# Patient Record
Sex: Male | Born: 1965
Health system: Southern US, Community
[De-identification: ages and names within clinical notes are randomized; demographics above are authoritative.]

## PROBLEM LIST (undated history)

## (undated) DIAGNOSIS — G4700 Insomnia, unspecified: Secondary | ICD-10-CM

## (undated) DIAGNOSIS — G479 Sleep disorder, unspecified: Secondary | ICD-10-CM

## (undated) DIAGNOSIS — E785 Hyperlipidemia, unspecified: Secondary | ICD-10-CM

## (undated) DIAGNOSIS — L679 Hair color and hair shaft abnormality, unspecified: Secondary | ICD-10-CM

## (undated) HISTORY — DX: Insomnia, unspecified: G47.00

## (undated) HISTORY — PX: OTHER SURGICAL HISTORY: SHX169

## (undated) HISTORY — PX: SEPTOPLASTY: SUR1290

## (undated) HISTORY — DX: Hair color and hair shaft abnormality, unspecified: L67.9

## (undated) HISTORY — DX: Sleep disorder, unspecified: G47.9

## (undated) HISTORY — PX: COLONOSCOPY: SHX174

## (undated) HISTORY — DX: Hyperlipidemia, unspecified: E78.5

---

## 2010-06-04 ENCOUNTER — Emergency Department (HOSPITAL_COMMUNITY)
Admission: EM | Admit: 2010-06-04 | Discharge: 2010-06-04 | Payer: Self-pay | Source: Home / Self Care | Admitting: Emergency Medicine

## 2010-06-04 LAB — POCT H PYLORI SCREEN: H. PYLORI SCREEN, POC: NEGATIVE

## 2010-12-10 ENCOUNTER — Emergency Department (HOSPITAL_COMMUNITY)
Admission: EM | Admit: 2010-12-10 | Discharge: 2010-12-11 | Disposition: A | Discharge status: Home / Self Care | Payer: 59 | Attending: Emergency Medicine | Admitting: Emergency Medicine

## 2010-12-10 DIAGNOSIS — R1033 Periumbilical pain: Secondary | ICD-10-CM | POA: Insufficient documentation

## 2010-12-10 DIAGNOSIS — K5289 Other specified noninfective gastroenteritis and colitis: Secondary | ICD-10-CM | POA: Insufficient documentation

## 2010-12-10 DIAGNOSIS — K279 Peptic ulcer, site unspecified, unspecified as acute or chronic, without hemorrhage or perforation: Secondary | ICD-10-CM | POA: Insufficient documentation

## 2010-12-10 DIAGNOSIS — R110 Nausea: Secondary | ICD-10-CM | POA: Insufficient documentation

## 2010-12-10 DIAGNOSIS — R1013 Epigastric pain: Secondary | ICD-10-CM | POA: Insufficient documentation

## 2010-12-11 ENCOUNTER — Emergency Department (HOSPITAL_COMMUNITY): Payer: 59

## 2010-12-11 ENCOUNTER — Inpatient Hospital Stay (HOSPITAL_COMMUNITY)
Admission: EM | Admit: 2010-12-11 | Discharge: 2010-12-17 | DRG: 386 | Disposition: A | Discharge status: Home / Self Care | Payer: 59 | Source: Ambulatory Visit | Attending: Internal Medicine | Admitting: Internal Medicine

## 2010-12-11 DIAGNOSIS — K5000 Crohn's disease of small intestine without complications: Principal | ICD-10-CM | POA: Diagnosis present

## 2010-12-11 DIAGNOSIS — R112 Nausea with vomiting, unspecified: Secondary | ICD-10-CM

## 2010-12-11 DIAGNOSIS — I809 Phlebitis and thrombophlebitis of unspecified site: Secondary | ICD-10-CM | POA: Diagnosis present

## 2010-12-11 DIAGNOSIS — K56609 Unspecified intestinal obstruction, unspecified as to partial versus complete obstruction: Secondary | ICD-10-CM | POA: Diagnosis present

## 2010-12-11 DIAGNOSIS — R1084 Generalized abdominal pain: Secondary | ICD-10-CM

## 2010-12-11 DIAGNOSIS — G609 Hereditary and idiopathic neuropathy, unspecified: Secondary | ICD-10-CM | POA: Diagnosis present

## 2010-12-11 LAB — COMPREHENSIVE METABOLIC PANEL
ALT: 21 U/L (ref 0–53)
AST: 20 U/L (ref 0–37)
Albumin: 3.9 g/dL (ref 3.5–5.2)
Alkaline Phosphatase: 50 U/L (ref 39–117)
BUN: 12 mg/dL (ref 6–23)
CO2: 25 mEq/L (ref 19–32)
Calcium: 9.3 mg/dL (ref 8.4–10.5)
Chloride: 103 mEq/L (ref 96–112)
Creatinine, Ser: 0.86 mg/dL (ref 0.50–1.35)
GFR calc Af Amer: >60 mL/min (ref >60)
GFR calc non Af Amer: >60 mL/min (ref >60)
Glucose, Bld: 101 mg/dL — ABNORMAL HIGH (ref 70–99)
Potassium: 3.7 mEq/L (ref 3.5–5.1)
Sodium: 139 mEq/L (ref 135–145)
Total Bilirubin: 0.4 mg/dL (ref 0.3–1.2)
Total Protein: 6.8 g/dL (ref 6.0–8.3)

## 2010-12-11 LAB — DIFFERENTIAL
Basophils Absolute: 0 10*3/uL (ref 0.0–0.1)
Basophils Relative: 0 % (ref 0–1)
Eosinophils Absolute: 0.1 10*3/uL (ref 0.0–0.7)
Eosinophils Relative: 1 % (ref 0–5)
Lymphocytes Relative: 18 % (ref 12–46)
Lymphs Abs: 2 10*3/uL (ref 0.7–4.0)
Monocytes Absolute: 0.5 10*3/uL (ref 0.1–1.0)
Monocytes Relative: 5 % (ref 3–12)
Neutro Abs: 8.1 10*3/uL — ABNORMAL HIGH (ref 1.7–7.7)
Neutrophils Relative %: 76 % (ref 43–77)

## 2010-12-11 LAB — CBC
HCT: 43.9 % (ref 39.0–52.0)
Hemoglobin: 15.6 g/dL (ref 13.0–17.0)
MCH: 28.9 pg (ref 26.0–34.0)
MCHC: 35.5 g/dL (ref 30.0–36.0)
MCV: 81.4 fL (ref 78.0–100.0)
Platelets: 240 10*3/uL (ref 150–400)
RBC: 5.39 MIL/uL (ref 4.22–5.81)
RDW: 12.4 % (ref 11.5–15.5)
WBC: 10.7 10*3/uL — ABNORMAL HIGH (ref 4.0–10.5)

## 2010-12-11 LAB — C-REACTIVE PROTEIN: CRP: 1.3 mg/dL — ABNORMAL HIGH (ref <0.6)

## 2010-12-11 LAB — LIPASE, BLOOD: Lipase: 24 U/L (ref 11–59)

## 2010-12-11 MED ORDER — IOHEXOL 300 MG/ML  SOLN: 100.0000 mL | Freq: Once | INTRAMUSCULAR | Status: DC | PRN

## 2010-12-12 ENCOUNTER — Inpatient Hospital Stay (HOSPITAL_COMMUNITY): Payer: 59

## 2010-12-12 LAB — COMPREHENSIVE METABOLIC PANEL
ALT: 13 U/L (ref 0–53)
AST: 15 U/L (ref 0–37)
Albumin: 2.8 g/dL — ABNORMAL LOW (ref 3.5–5.2)
Alkaline Phosphatase: 38 U/L — ABNORMAL LOW (ref 39–117)
BUN: 7 mg/dL (ref 6–23)
CO2: 25 mEq/L (ref 19–32)
Calcium: 8 mg/dL — ABNORMAL LOW (ref 8.4–10.5)
Chloride: 107 mEq/L (ref 96–112)
Creatinine, Ser: 0.73 mg/dL (ref 0.50–1.35)
GFR calc Af Amer: >60 mL/min (ref >60)
GFR calc non Af Amer: >60 mL/min (ref >60)
Glucose, Bld: 96 mg/dL (ref 70–99)
Potassium: 4.1 mEq/L (ref 3.5–5.1)
Sodium: 138 mEq/L (ref 135–145)
Total Bilirubin: 0.6 mg/dL (ref 0.3–1.2)
Total Protein: 5.1 g/dL — ABNORMAL LOW (ref 6.0–8.3)

## 2010-12-12 LAB — DIFFERENTIAL
Basophils Absolute: 0 10*3/uL (ref 0.0–0.1)
Basophils Relative: 1 % (ref 0–1)
Eosinophils Absolute: 0.2 10*3/uL (ref 0.0–0.7)
Eosinophils Relative: 4 % (ref 0–5)
Lymphocytes Relative: 42 % (ref 12–46)
Lymphs Abs: 2.4 10*3/uL (ref 0.7–4.0)
Monocytes Absolute: 0.4 10*3/uL (ref 0.1–1.0)
Monocytes Relative: 8 % (ref 3–12)
Neutro Abs: 2.6 10*3/uL (ref 1.7–7.7)
Neutrophils Relative %: 46 % (ref 43–77)

## 2010-12-12 LAB — CBC
HCT: 38.9 % — ABNORMAL LOW (ref 39.0–52.0)
Hemoglobin: 13.4 g/dL (ref 13.0–17.0)
MCH: 28.8 pg (ref 26.0–34.0)
MCHC: 34.4 g/dL (ref 30.0–36.0)
MCV: 83.5 fL (ref 78.0–100.0)
Platelets: 207 10*3/uL (ref 150–400)
RBC: 4.66 MIL/uL (ref 4.22–5.81)
RDW: 12.4 % (ref 11.5–15.5)
WBC: 5.7 10*3/uL (ref 4.0–10.5)

## 2010-12-13 ENCOUNTER — Inpatient Hospital Stay (HOSPITAL_COMMUNITY): Payer: 59

## 2010-12-13 DIAGNOSIS — R933 Abnormal findings on diagnostic imaging of other parts of digestive tract: Secondary | ICD-10-CM

## 2010-12-13 DIAGNOSIS — K56609 Unspecified intestinal obstruction, unspecified as to partial versus complete obstruction: Secondary | ICD-10-CM

## 2010-12-14 DIAGNOSIS — K56609 Unspecified intestinal obstruction, unspecified as to partial versus complete obstruction: Secondary | ICD-10-CM

## 2010-12-14 DIAGNOSIS — R933 Abnormal findings on diagnostic imaging of other parts of digestive tract: Secondary | ICD-10-CM

## 2010-12-15 ENCOUNTER — Other Ambulatory Visit: Payer: Self-pay | Admitting: Internal Medicine

## 2010-12-15 DIAGNOSIS — R109 Unspecified abdominal pain: Secondary | ICD-10-CM

## 2010-12-15 DIAGNOSIS — R933 Abnormal findings on diagnostic imaging of other parts of digestive tract: Secondary | ICD-10-CM

## 2010-12-15 DIAGNOSIS — K56609 Unspecified intestinal obstruction, unspecified as to partial versus complete obstruction: Secondary | ICD-10-CM

## 2010-12-15 LAB — SEDIMENTATION RATE: Sed Rate: 4 mm/hr (ref 0–16)

## 2010-12-15 LAB — CBC
HCT: 37.5 % — ABNORMAL LOW (ref 39.0–52.0)
Hemoglobin: 13.3 g/dL (ref 13.0–17.0)
MCH: 29.2 pg (ref 26.0–34.0)
MCHC: 35.5 g/dL (ref 30.0–36.0)
MCV: 82.2 fL (ref 78.0–100.0)
Platelets: 237 10*3/uL (ref 150–400)
RBC: 4.56 MIL/uL (ref 4.22–5.81)
RDW: 12.3 % (ref 11.5–15.5)
WBC: 9.1 10*3/uL (ref 4.0–10.5)

## 2010-12-15 LAB — BASIC METABOLIC PANEL
BUN: 7 mg/dL (ref 6–23)
CO2: 24 mEq/L (ref 19–32)
Calcium: 8.9 mg/dL (ref 8.4–10.5)
Chloride: 110 mEq/L (ref 96–112)
Creatinine, Ser: 0.75 mg/dL (ref 0.50–1.35)
GFR calc non Af Amer: >60 mL/min (ref >60)
Glucose, Bld: 122 mg/dL — ABNORMAL HIGH (ref 70–99)
Potassium: 4.6 mEq/L (ref 3.5–5.1)
Sodium: 142 mEq/L (ref 135–145)

## 2010-12-15 LAB — C-REACTIVE PROTEIN: CRP: 0.4 mg/dL — ABNORMAL LOW (ref <0.6)

## 2010-12-16 ENCOUNTER — Encounter: Payer: Self-pay | Admitting: Internal Medicine

## 2010-12-16 DIAGNOSIS — R933 Abnormal findings on diagnostic imaging of other parts of digestive tract: Secondary | ICD-10-CM

## 2010-12-16 DIAGNOSIS — K219 Gastro-esophageal reflux disease without esophagitis: Secondary | ICD-10-CM

## 2010-12-16 DIAGNOSIS — R1013 Epigastric pain: Secondary | ICD-10-CM

## 2010-12-17 DIAGNOSIS — K56609 Unspecified intestinal obstruction, unspecified as to partial versus complete obstruction: Secondary | ICD-10-CM

## 2010-12-17 DIAGNOSIS — R933 Abnormal findings on diagnostic imaging of other parts of digestive tract: Secondary | ICD-10-CM

## 2010-12-17 DIAGNOSIS — K5289 Other specified noninfective gastroenteritis and colitis: Secondary | ICD-10-CM

## 2010-12-31 ENCOUNTER — Inpatient Hospital Stay (INDEPENDENT_AMBULATORY_CARE_PROVIDER_SITE_OTHER)
Admission: RE | Admit: 2010-12-31 | Discharge: 2010-12-31 | Disposition: A | Discharge status: Home / Self Care | Payer: 59 | Source: Ambulatory Visit | Attending: Family Medicine | Admitting: Family Medicine

## 2010-12-31 ENCOUNTER — Ambulatory Visit (INDEPENDENT_AMBULATORY_CARE_PROVIDER_SITE_OTHER): Payer: 59

## 2010-12-31 DIAGNOSIS — S92919A Unspecified fracture of unspecified toe(s), initial encounter for closed fracture: Secondary | ICD-10-CM

## 2011-01-22 NOTE — H&P (Signed)
[name], [name] NO.:  [id number]  MEDICAL RECORD NO.:  [id number]  LOCATION:  [facility]                         FACILITY:  [facility]  PHYSICIAN:  Victor Llano, MD       DATE OF BIRTH:  10-07-1965  DATE OF ADMISSION:  12/11/2010 DATE OF DISCHARGE:                             HISTORY & PHYSICAL   PRIMARY CARE PHYSICIAN:  Victor Murphy. Renne Crigler, MD  REASON FOR ADMISSION:  Abdominal pain.  [name] is a 45 year old Caucasian male who has no significant past medical history, came into the hospital, complaining about abdominal pain.  The patient's story started about 3 months ago with on and off epigastric abdominal pain.  The patient was evaluated by his primary care physician and treated empirically for gastritis/PUD with proton pump inhibitors.  Pain was on and off, but he was tolerating that.  Two days prior to admission, he started to have some epigastric pain, which is escalated to the point where he have to come to the hospital last night.  The pain was epigastric, 10/10, no radiation, sharp.  Did not remember anything increases or decreases the pain.  The pain is associated with nausea and vomiting.  The patient vomited about 3 times yesterday, which contains food particles, denies any bloody vomiting. The patient denies fever, chills, or weight loss.  Denies diarrhea. Upon initial evaluation in the emergency department, the patient has normal lipase and ultrasound.  CT scan of abdomen and pelvis showed inflammatory process in the right lower quadrant with a stranding and thickening of the terminal ileum suggesting ileitis.  PAST MEDICAL HISTORY: 1. Gastritis/PUD. 2. Right peroneal neuropathy.  MEDICATIONS:  Prilosec and Ambien.  ALLERGIES:  NKDA.  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  He lives with his wife.  She is a Engineer, civil (consulting).  They have 27- year-old daughter.  He does not smoke or drink or use illicit  drugs.  FAMILY HISTORY:  Pretty nonsignificant.  Father is healthy.  Mother had breast cancer.  Siblings are healthy.  REVIEW OF SYSTEMS:  GENERAL:  Denies fever and sweats.  Sometimes does have chills.  HEENT:  Denies headache, nasal discharge, vertigo, photophobia.  SKIN:  Denies rash, lesions.  CARDIAC:  Denies chest pain, PND.  PULMONARY:  Denies wheezing, shortness of breath.  GU:  Denies frequency, urgency, dysuria, straining.  NEURO:  Denies weakness, numbness.  Does have right peroneal nerve problems and had surgery for release before.  PSYCH:  Denies mood disturbance, depression.  Denies anxiety.  MUSCULOSKELETAL:  Denies joint swelling, deformity, or pain. GI:  Has nausea, vomiting, abdominal pain.  Denies melena, hematemesis odynophagia.  ENDOCRINE:  Denies polyuria, polydipsia.  PHYSICAL EXAMINATION:  VITAL SIGNS:  Temperature is 97.6, respirations 20, pulse rate is 78, blood pressure is 109/76, O2 sats is 99% on room air. GENERAL:  The patient is a well-developed, well-nourished Caucasian male, wearing hospital gown, lying on his back without acute distress. HEAD:  Normocephalic, atraumatic. EYES:  Pupils equal, reactive to light and accommodation. ENT:  No gross deformities. MOUTH:  Without oral thrush or lesions. CHEST:  Symmetrical to breathing  bilaterally. LUNGS:  Clear to auscultation bilaterally. CARDIOVASCULAR:  Regular rate and rhythm.  No murmurs, rubs, or gallops. ABDOMEN:  Bowel sounds heard.  Soft, nondistended.  There is moderate tenderness in the right lower quadrant with some guarding.  No rebound tenderness. RECTAL:  Deferred. SKIN:  No rash or lesions. MUSCULOSKELETAL:  No joint deformity or lesions. NEUROLOGIC:  Alert, awake, oriented x3.  Cranial nerves II through XII grossly intact.  No gross motor or sensory deficit.  Gait was not tested.  LABORATORY DATA: 1. BMP:  Sodium 139, potassium 3.7, chloride 103, bicarb is 25,     glucose 101, BUN is  12, creatinine is 0.86. 2. LFTs:  AST 20, ALT 21, total protein 6.8, albumin 3.9, total bili     is 0.4, alk phos is 50, lipase is 24. 3. CBC:  WBCs 10.7, hemoglobin 15.6, hematocrit 43.9, platelets is     240.  RADIOLOGY: 1. Ultrasound.  Negative abdominal ultrasound. 2. CT scan of the abdomen and pelvis showed inflammatory process in     the right lower quadrant with wall thickening of the terminal ileum     and surrounding inflammatory stranding and edema, mild proximal     diffuse, small bowel dilatation changes, probably representing     infectious or inflammatory ileitis.  The appendiceal diameter is     mildly enlarged, probably representing reactive inflammation.  ASSESSMENT AND PLAN: 1. Terminal ileitis.  The patient has pain on and off for sometimes.     He does not have history of inflammatory bowel disease in his     family.  We will treat empirically as infectious ileitis with Cipro     and Flagyl.  I will call Gastroenterology to help in managing Dr.     Melvyn Marsh.  I will put him on clear liquids and advance as tolerated to     give some rest to his bowels. 2. History of gastritis.  The patient was on PPI.  This diagnosis was entertained in the same time when the patient has the pain.  I     guess it was referred pain because he is still complaining about     epigastric pain, but no right lower quadrant tenderness, but I will     continue on his PPI and is to follow up as outpatient. 3. History of right peroneal peripheral neuropathy.  The patient said     he have to go through peroneal nerve release before for that.  He     has complaint now. 4. Abdominal pain, likely secondary to the ileitis.  Control with pain     medication and bowel rest.     Victor Llano, MD     ME/MEDQ  D:  12/11/2010  T:  12/11/2010  Job:  [id number]  cc:   [name], M.D.  Electronically Signed by [name]  on 01/22/2011 12:16:03 PM

## 2011-02-21 NOTE — Discharge Summary (Signed)
  [name], [name] NO.:  [id number]  MEDICAL RECORD NO.:  [id number]  LOCATION:  5034                         FACILITY:  [facility]  PHYSICIAN:  Hedwig Morton. Juanda Chance, MD     DATE OF BIRTH:  05-17-1966  DATE OF ADMISSION:  12/11/2010 DATE OF DISCHARGE:  12/17/2010                              DISCHARGE SUMMARY   DISCHARGING PHYSICIAN:  Hedwig Morton. Juanda Chance, MD  PRIMARY GASTROENTEROLOGIST:  Wilhemina Bonito. Marina Goodell, MD  DISPOSITION:  Home in stable condition.  DISCHARGE MEDICATIONS: 1. Fish oil 1000 mg twice daily. 2. Finasteride 5 mg daily. 3. Zaleplon 5 mg 1-2 tablets daily at bedtime as needed.  CONSULTATIONS:  None requested.  PROCEDURES: 1. Colonoscopy by Dr. Lina Sar. 2. Upper endoscopy by Dr. Juanda Chance.  DISCHARGE DIAGNOSES: 1. Partial small bowel obstruction of unclear etiology, resolved. 2. Right peroneal neuropathy.  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  His symptoms escalated over the 3-4 weeks preceding hospital admission. He tried Prilosec without much improvement in symptoms. Initial labs included a normal WBC, hemoglobin, lipase and LFTs. CRP unremarkable at 1.3. Ultrasound was normal. CT scan of the abdomen / pelvis showed mild proximal diffuse small bowel dilation with stranding and thickening  of the terminal ileum suggestive of ileitis. Patient was admitted by [facility] but subsequently transferred to our service. He was started on Cipro and Flagyl with plans to pursue colonoscopy when bowel prep could be tolerated. Over the following couple of days his pain stayed about the same despite improvement in partial small bowel obstruction. Surprisingly, patient's colonoscopy was normal without any signs of ileitis as suggested  by the CT scan.  Upper endoscopy on the same day was also completely  normal.   Biopsies of the terminal ileum as well as random colon biopsies were normal. Following endoscopy, the patient's diet was advanced to full liquids  and he was discharged home on the following day with plans to follow up with Dr. Marina Goodell.     Willette Cluster, NP   ______________________________ Hedwig Morton. Juanda Chance, MD    PG/MEDQ  D:  02/06/2011  T:  02/06/2011  Job:  [id number]  Electronically Signed by Willette Cluster NP on 02/09/2011 05:46:32 PM Electronically Signed by Lina Sar MD on 02/21/2011 11:43:51 PM

## 2011-05-01 ENCOUNTER — Other Ambulatory Visit (HOSPITAL_COMMUNITY): Payer: Self-pay | Admitting: Internal Medicine

## 2011-05-01 DIAGNOSIS — M549 Dorsalgia, unspecified: Secondary | ICD-10-CM

## 2011-05-01 DIAGNOSIS — M79604 Pain in right leg: Secondary | ICD-10-CM

## 2011-05-04 ENCOUNTER — Ambulatory Visit (HOSPITAL_COMMUNITY)
Admission: RE | Admit: 2011-05-04 | Discharge: 2011-05-04 | Disposition: A | Discharge status: Home / Self Care | Payer: 59 | Source: Ambulatory Visit | Attending: Internal Medicine | Admitting: Internal Medicine

## 2011-05-04 DIAGNOSIS — R29898 Other symptoms and signs involving the musculoskeletal system: Secondary | ICD-10-CM | POA: Insufficient documentation

## 2011-05-04 DIAGNOSIS — M47817 Spondylosis without myelopathy or radiculopathy, lumbosacral region: Secondary | ICD-10-CM | POA: Insufficient documentation

## 2011-05-04 DIAGNOSIS — R209 Unspecified disturbances of skin sensation: Secondary | ICD-10-CM | POA: Insufficient documentation

## 2011-05-04 DIAGNOSIS — M545 Low back pain, unspecified: Secondary | ICD-10-CM | POA: Insufficient documentation

## 2011-05-04 DIAGNOSIS — M47814 Spondylosis without myelopathy or radiculopathy, thoracic region: Secondary | ICD-10-CM | POA: Insufficient documentation

## 2011-05-04 DIAGNOSIS — M79604 Pain in right leg: Secondary | ICD-10-CM

## 2011-05-04 DIAGNOSIS — M549 Dorsalgia, unspecified: Secondary | ICD-10-CM

## 2012-09-03 IMAGING — CT CT ABD-PELV W/ CM
2 of 5 series · 17 of 46 positions shown, 19 images · IV contrast (100ml omni 300)
Comparison: None.

CLINICAL DATA: Abdominal pain.  Gastritis and peptic ulcer disease.

CT ABDOMEN AND PELVIS WITH CONTRAST
TECHNIQUE: Multidetector CT imaging of the abdomen and pelvis was
performed following the standard protocol during bolus
administration of intravenous contrast.
Contrast: 100 ml Omnipaque 300

[Series 2: routine abdomen · axial · 0.74mm/px · z∈[-498,-98]mm · 14 of 91 slices shown, 16 images]
[im 6/91  soft-tissue]
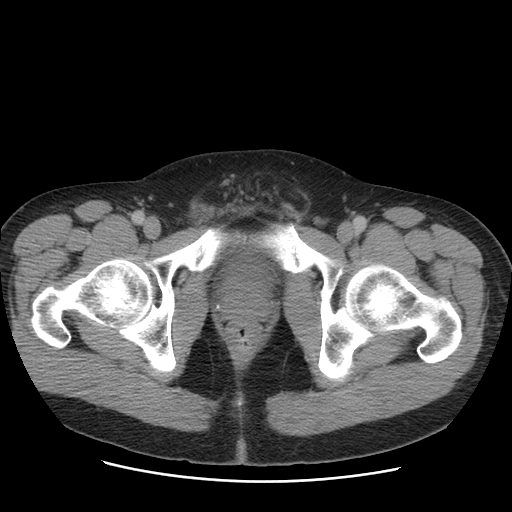
[im 6/91  bone]
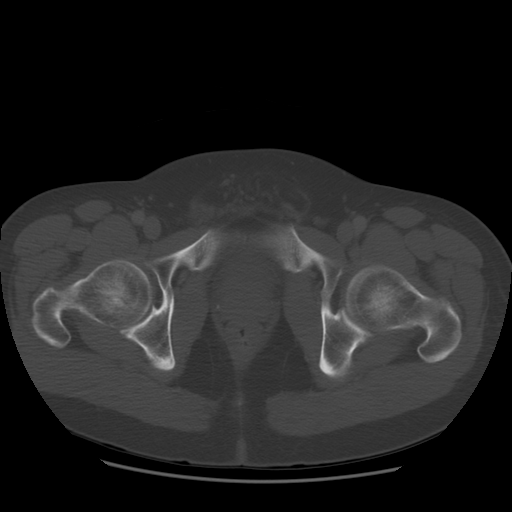
[im 11/91  soft-tissue]
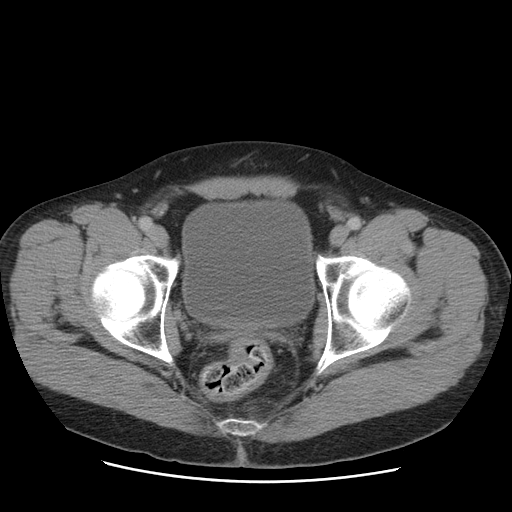
[im 21/91  soft-tissue]
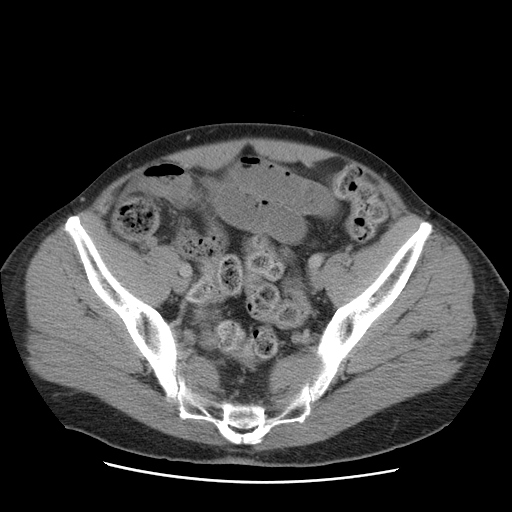
[im 26/91  soft-tissue]
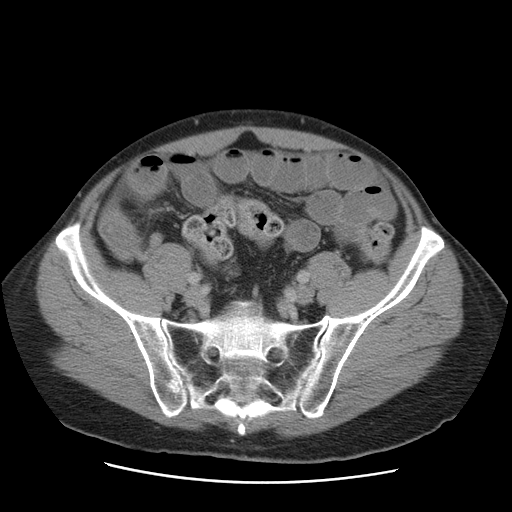
[im 31/91  soft-tissue]
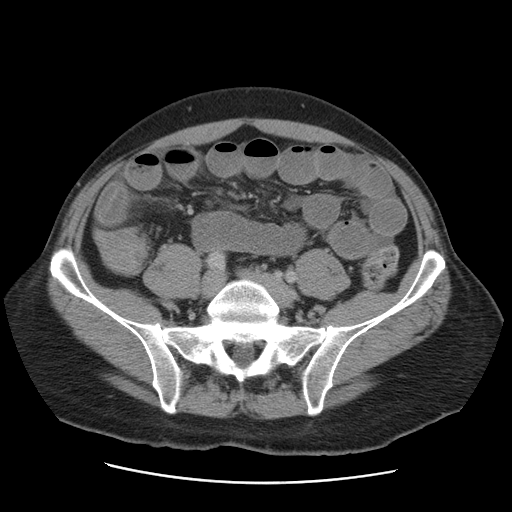
[im 36/91  soft-tissue]
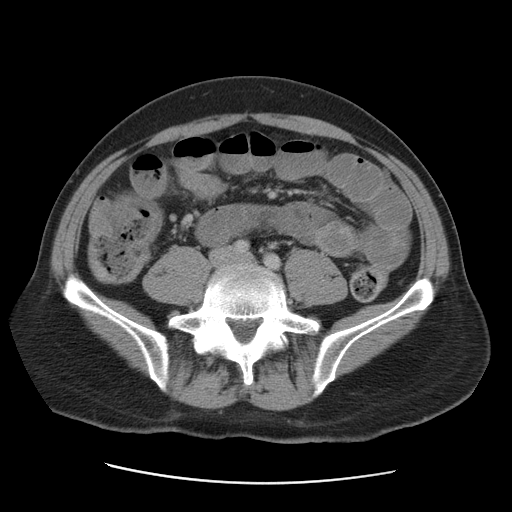
[im 41/91  soft-tissue]
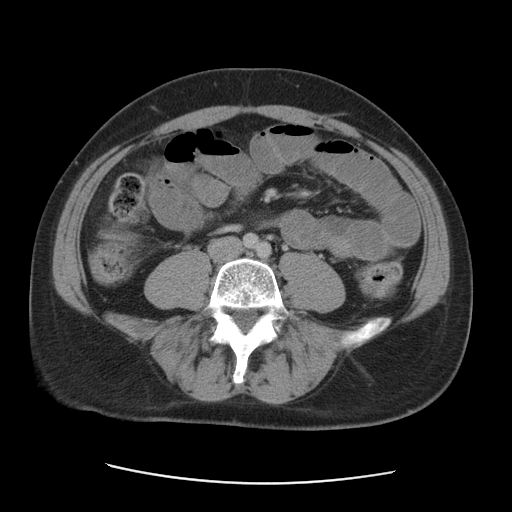
[im 51/91  soft-tissue]
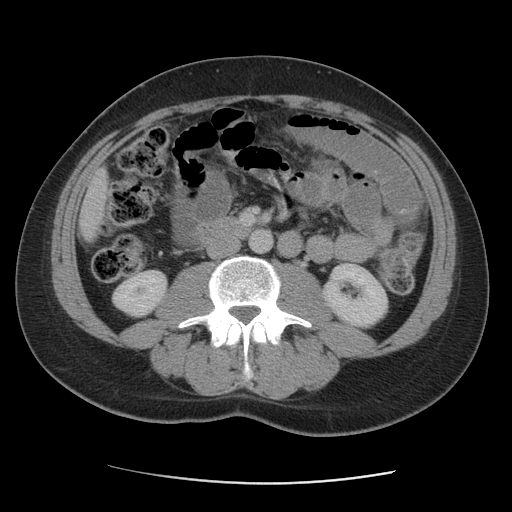
[im 56/91  soft-tissue]
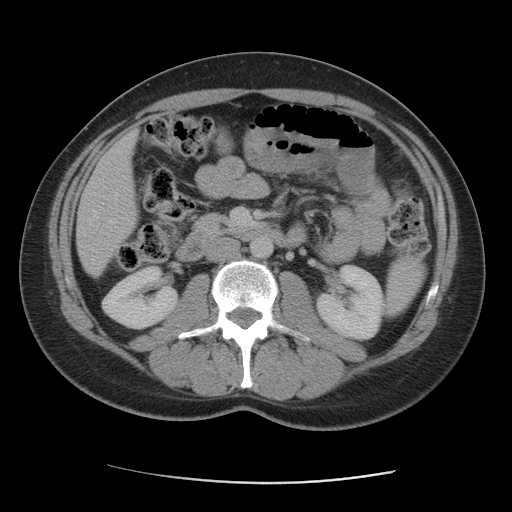
[im 56/91  bone]
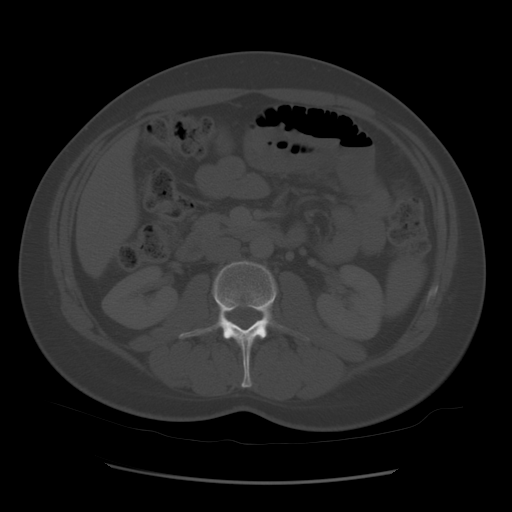
[im 61/91  soft-tissue]
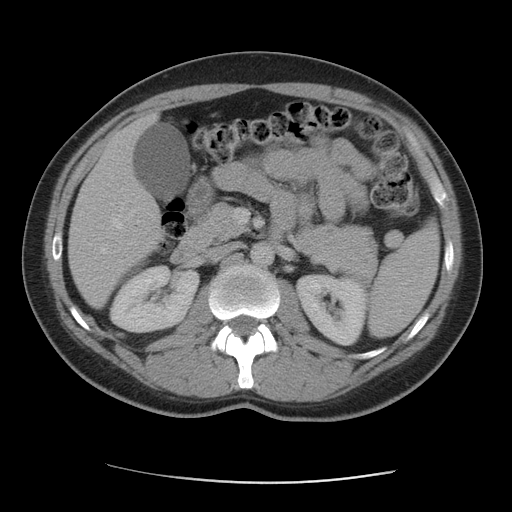
[im 66/91  soft-tissue]
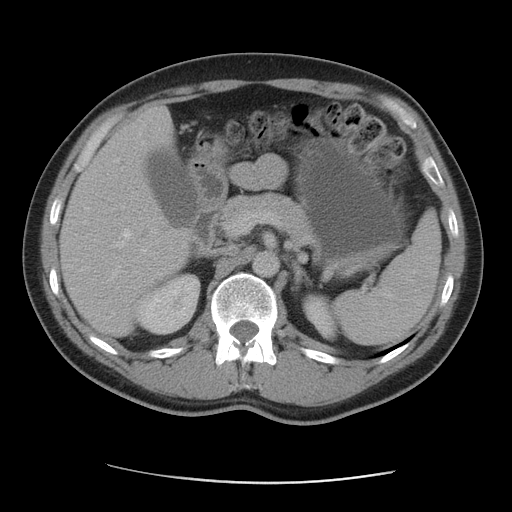
[im 71/91  soft-tissue]
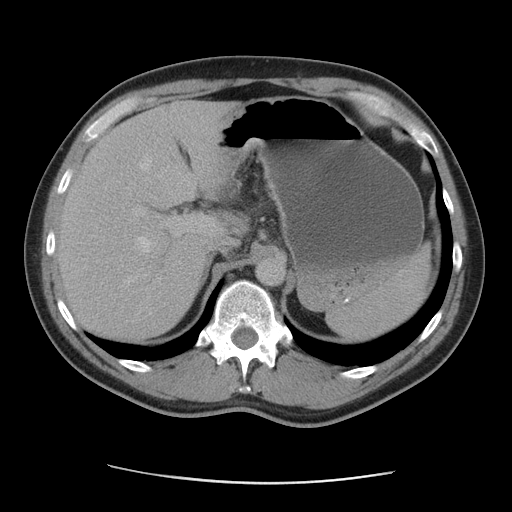
[im 81/91  soft-tissue]
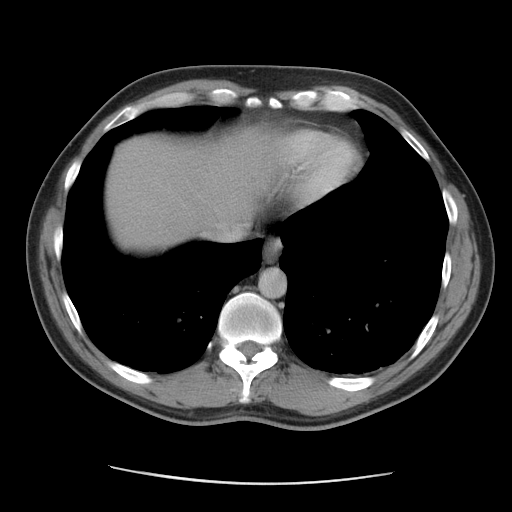
[im 86/91  soft-tissue]
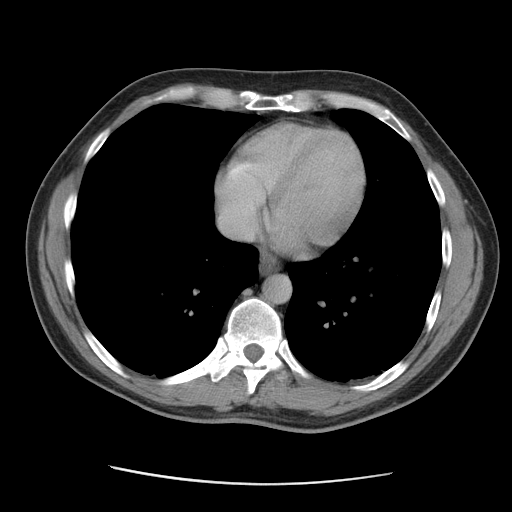

[Series 401: cor · coronal · 1.00mm/px · 3 of 103 slices shown]
[im 35/103  soft-tissue]
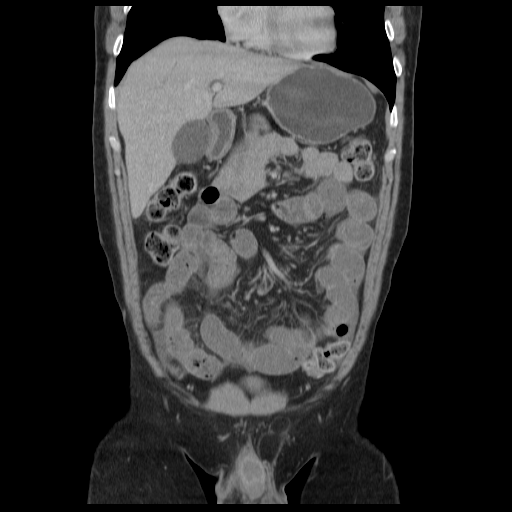
[im 46/103  soft-tissue]
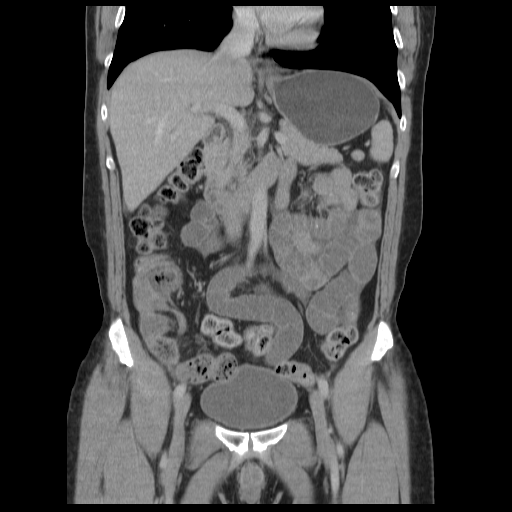
[im 57/103  soft-tissue]
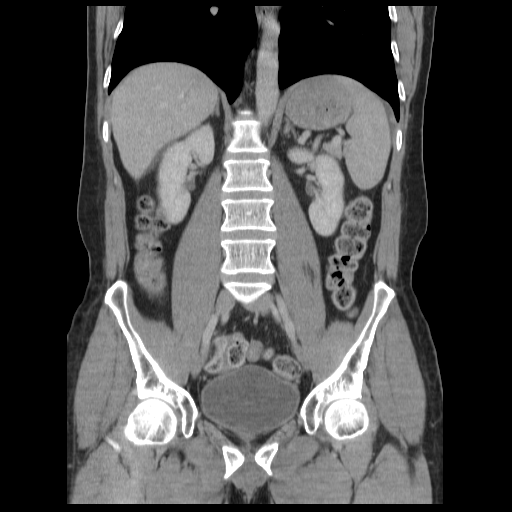

[17 of 46 positions shown; findings below may reference images not displayed]

FINDINGS: Slight fibrosis in the lung bases.  Tiny cyst
demonstrated scattered throughout the liver might represent small
simple cyst or perhaps biliary hamartomas.  The gallbladder is
mildly distended without bile duct dilatation.  The pancreas is
unremarkable.  Small accessory spleen.  The stomach is
unremarkable.  No adrenal gland nodules.  1 cm parenchymal cyst in
the left kidney.  No solid mass or hydronephrosis in either kidney.
There is no free fluid or free air in the abdomen.  There is mild
diffuse dilatation of the small bowel throughout with bowel wall
thickening in the terminal ileum.  There is a small amount of
stranding in the right lower quadrant.  The appendiceal diameter is
mildly prominent.  While this could represent early appendicitis,
the degree of inflammatory changes out of proportion to the
appearance of the appendix and this is probably primarily an
inflammatory process involving the terminal ileum.  This could
represent infectious ileitis or changes of inflammatory bowel
disease.  The colon is unremarkable.  No loculated fluid
collections in the pelvis.  The bladder wall is not thickened.
Normal alignment of the lumbar spine.
IMPRESSION: Inflammatory process in the right lower quadrant with wall
thickening of the terminal ileum and surrounding inflammatory
stranding and edema.  Mild proximal diffuse small bowel dilatation.
Changes probably represent infectious or inflammatory ileitis.  The
appendiceal diameter is mildly enlarged, probably representing
reactive inflammation.

## 2012-09-28 ENCOUNTER — Ambulatory Visit (INDEPENDENT_AMBULATORY_CARE_PROVIDER_SITE_OTHER): Payer: PRIVATE HEALTH INSURANCE | Admitting: Sports Medicine

## 2012-09-28 VITALS — BP 110/60 | Ht 74.0 in | Wt 187.0 lb

## 2012-09-28 DIAGNOSIS — M7671 Peroneal tendinitis, right leg: Secondary | ICD-10-CM | POA: Insufficient documentation

## 2012-09-28 DIAGNOSIS — M7750 Other enthesopathy of unspecified foot: Secondary | ICD-10-CM

## 2012-09-28 MED ORDER — GABAPENTIN 300 MG PO CAPS: 300.0000 mg | ORAL_CAPSULE | Freq: Every day | ORAL | Status: DC

## 2012-09-28 MED ORDER — NITROGLYCERIN 0.2 MG/HR TD PT24: MEDICATED_PATCH | TRANSDERMAL | Status: DC

## 2012-09-28 NOTE — Progress Notes (Signed)
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 Patient states that he remembers in 2003 when he was active and doing a lot of exercises he had some pain in that area. 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 Patient was seen by Dr. Renae Fickle at [facility] and did have a peroneal nerve release which did not seem to improve his pain. Patient states that having more pain on the lateral aspect of the calf here more recently. Patient denies any radiation of pain, denies any numbness or tingling. Patient states that it is worse after a long day of standing. Patient states that the pain can wake him up at night and he does wear an Ace wrap to sleep sometimes because it helps. Patient also states that massage does help as well. The patient denies any true foot drop but states at the end of the day he can notice that he has to be careful about how he walks.  No past medical history on file.  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  Physical exam Blood pressure 110/60, height 6\' 2"  (1.88 m), weight 187 lb (84.823 kg). General: No apparent distress alert and oriented x3 mood and affect normal Respiratory: Patient's speak in full sentences and does not appear short of breath Skin: Warm dry intact with no signs of infection or rash Neuro: Cranial nerves II through XII are intact, neurovascularly intact in all extremities with 2+ DTRs and 2+ pulses. Right calf exam: On inspection patient does have an incision that is well healed from prior surgery over the fibular head the right side. Patient  does have full range of motion of the ankle as well as the knee with no crepitus. Patient has full strength distally and is neurovascularly intact. Patient has a negative straight leg test but does have tightness of the Fabere test bilaterally. Patient is minimally tender to palpation over the lateral gastroc head near the fibular head. This could also be the peroneals.  Muscular skeletal ultrasound was performed and interpreted by me today. Patient's ultrasound shows that he does have hypoechoic changes in the proximal peroneal just inferior to the fibular head to approximately 1-3 cm that is appreciated as a chronic tear with calcific changes. These calcific changes and do have proximity to the adjacent peroneal nerve. With compression patient does have pain in this area. Patient does have some neovascularization in this area.

## 2012-09-28 NOTE — Patient Instructions (Addendum)
Very nice to meet you Wear the compression daily  Try the nitro patch 1/4 patch in area daily.  You may get a headache.  Also move area daily.  Try the exercises daily.  Neurontin at night.  Come back and see [place] again in 6 weeks.

## 2012-09-28 NOTE — Assessment & Plan Note (Addendum)
The patient has what appears to be a proximal peroneal tear. Patient given a knee compression sleeve that he can wear daily to decrease any inflammation. A home exercise program was given to him. Patient will start Neurontin 300 mg at night and warned about potential side effects. Patient will start wearing a nitroglycerin patch quarter patch daily in the area and warned of potential side effects. Patient will try these interventions and an come back again in 6 weeks for further evaluation

## 2012-11-09 ENCOUNTER — Ambulatory Visit (INDEPENDENT_AMBULATORY_CARE_PROVIDER_SITE_OTHER): Payer: PRIVATE HEALTH INSURANCE | Admitting: Sports Medicine

## 2012-11-09 VITALS — BP 110/62 | Ht 74.0 in | Wt 190.0 lb

## 2012-11-09 DIAGNOSIS — M7671 Peroneal tendinitis, right leg: Secondary | ICD-10-CM

## 2012-11-09 DIAGNOSIS — M7750 Other enthesopathy of unspecified foot: Secondary | ICD-10-CM

## 2012-11-09 DIAGNOSIS — I83893 Varicose veins of bilateral lower extremities with other complications: Secondary | ICD-10-CM | POA: Insufficient documentation

## 2012-11-09 MED ORDER — GABAPENTIN 300 MG PO CAPS: 300.0000 mg | ORAL_CAPSULE | Freq: Three times a day (TID) | ORAL | Status: DC

## 2012-11-09 NOTE — Assessment & Plan Note (Signed)
Still treating as if his peroneal release has led to peroneal nerve compression and peroneal spasm  Not sure there is a true tendinopathy

## 2012-11-09 NOTE — Assessment & Plan Note (Signed)
If he does not get a response I would send him for evaluation of vein specialist to see if he thinks sxs could relate to the large deep varicosities

## 2012-11-09 NOTE — Patient Instructions (Addendum)
Wear the compression sleeve on calf area.   For the neurontin: increase it to 300mg  three times a day, after 1 week, increase it to 300mg  per day for 3 days. Then increase to 300mg , 600mg , 600mg  then after 3 days increase to 600mg , 600mg , 600mg  and so on, if you can tolerate the side effects.   Follow up in 4 weeks.

## 2012-11-09 NOTE — Progress Notes (Signed)
  Subjective:    Patient ID: [name], male    DOB: 1966/01/04, 47 y.o.   MRN: [id number]  HPI 47 yo male who presents for follow up of right calf pain which has been an ongoing problem for 10 years. Patient was initially seen at sports medicine on 09/28/12 and thought to have proximal peroneal tear with some calcific changes around the peroneal nerve.  Since his last visit, patient's symptoms have remained unchanged. He reports feeling of constant "charlie horse" in lateral aspect of calf with ongoing twitching of the peroneal muscle, worst after a long day. 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 After a long day on his feet, he has a sensation of tripping over his feet.  He has been using the nitro patches which have not significantly helped. He uses the compression sleeve over his knee and also wraps his leg with ace bandage wrap which relieves some of the discomfort. He has taken the neurontin 300mg  daily and has not noticed a big change. He denies any significant somnolence with it.  He has done the home exercise program which provides relief for 20 minutes.    Review of Systems Negative except per HPI    Objective:   Physical Exam Filed Vitals:   11/09/12 0909  BP: 110/62   General: pleasant gentleman, in no acute distress Right Leg: no soft tissue swelling, lateral scar from peroneal nerve release well healed Some tenderness with palpation with US probe along lateral aspect of calf Normal knee flexion and extension range and strength, normal plantar and dorsiflexion range of movement and strength, decreased flexion of all toes on right side compared to left with normal flexion of strength of great toe bilaterally   Musculoskeletal [place]:  Calcifications noted around peroneal nerve 0.77cm deep varicose vein in gastroc muscle Note this is very dilated in area of pain    Assessment & Plan:  47 yo male with chronic long standing right  peroneal muscle discomfort, who underwent peroneal nerve release in 2004. Pain and discomfort not significantly better with combination of compression, nitro, stretches and neurontin 300mg  daily. Nerve compression could be from calcification sheath around it as well as from compression from varicose vein.   - increase neurontin to 300mg  tid for 1 week with gradual increase by 300mg  daily every 3 days - continue compression and apply it to the calf area - continue home exercises - stop nitro since no significant relief - follow up in 4 weeks. If not better, may benefit from custom orthotics to put pressure off of peroneal muscle and nerve.

## 2012-12-08 ENCOUNTER — Encounter: Payer: Self-pay | Admitting: Sports Medicine

## 2012-12-08 ENCOUNTER — Ambulatory Visit (INDEPENDENT_AMBULATORY_CARE_PROVIDER_SITE_OTHER): Payer: PRIVATE HEALTH INSURANCE | Admitting: Sports Medicine

## 2012-12-08 VITALS — BP 120/79 | HR 80 | Ht 74.0 in | Wt 188.0 lb

## 2012-12-08 DIAGNOSIS — I83893 Varicose veins of bilateral lower extremities with other complications: Secondary | ICD-10-CM

## 2012-12-08 DIAGNOSIS — M79661 Pain in right lower leg: Secondary | ICD-10-CM

## 2012-12-08 DIAGNOSIS — M7671 Peroneal tendinitis, right leg: Secondary | ICD-10-CM

## 2012-12-08 DIAGNOSIS — M79609 Pain in unspecified limb: Secondary | ICD-10-CM

## 2012-12-08 DIAGNOSIS — M7750 Other enthesopathy of unspecified foot: Secondary | ICD-10-CM

## 2012-12-08 NOTE — Assessment & Plan Note (Addendum)
    Pt was comfortable and walking gait was neutral in orthotics  We will use the orthotics to see if reducing his supination reduces the amount of pain  Continue to use compression

## 2012-12-08 NOTE — Progress Notes (Signed)
  Subjective:    Patient ID: [name], male    DOB: 09-Sep-1965, 47 y.o.   MRN: [id number]  HPI This is a 47 yo male seen today in clinic for follow up of right lateral lower leg pain that has been problematic for the past 10 years.  He has been seen in the sports medicine clinic twice and has been taking neurontin which was steadily increased for his symptoms, although due to palpitations he stopped it completely.  He continues to get mild relief with ACE wrapping of the calf.  After working on feet for extended periods he notes increased twitching/spasm in the region of pain.  He is status post a peroneal nerve release on the right  Last ultrasound revealed that he had significant varicosities in the area of pain that w were deep   Review of Systems    Negative except HPI. Objective:   Physical Exam No acute distress  Right lower extremity: Strength and ROM within normal limits.  Surgical scar over region of fibular head. Pulses intact DP and PT.   Mild spider varicosity noted over ankle region. No edema noted. Nontender exam Mild supination of ankle noted with standing.      Assessment & Plan:  Peroneal tendonitis right lower extremity.  Patient was fitted for a :  semi-rigid orthotic. The orthotic was heated and afterward the patient stood on the orthotic blank positioned on the orthotic stand. The patient was positioned in subtalar neutral position and 10 degrees of ankle dorsiflexion in a weight bearing stance. After completion of molding, a stable base was applied to the orthotic blank. The blank was ground to a stable position for weight bearing. Size:13 Base: [place]  Posting:Lateral posting of right foot.  Patient also referred to vascular surgery for evaluation of varicosity.

## 2012-12-08 NOTE — Assessment & Plan Note (Signed)
I would like the opinion of Dr. Guss Bunde to see if this patient perhaps has some deep varicosities that may be related to some of his lateral right leg pain. I was able to see some dilated vessels on ultrasound in the office but have not done a full vascular evaluation.

## 2013-01-25 ENCOUNTER — Ambulatory Visit (INDEPENDENT_AMBULATORY_CARE_PROVIDER_SITE_OTHER): Payer: PRIVATE HEALTH INSURANCE | Admitting: *Deleted

## 2013-01-25 DIAGNOSIS — R002 Palpitations: Secondary | ICD-10-CM

## 2013-01-25 NOTE — Progress Notes (Signed)
In office holter monitor placement. Patient voiced understanding of instructions given. Encouraged patient to call if problems arise, and to record all symptoms.

## 2013-02-03 ENCOUNTER — Other Ambulatory Visit: Payer: Self-pay | Admitting: *Deleted

## 2013-02-03 ENCOUNTER — Ambulatory Visit (INDEPENDENT_AMBULATORY_CARE_PROVIDER_SITE_OTHER): Payer: PRIVATE HEALTH INSURANCE | Admitting: Cardiovascular Disease

## 2013-02-03 ENCOUNTER — Encounter: Payer: Self-pay | Admitting: Cardiovascular Disease

## 2013-02-03 VITALS — BP 110/80 | HR 71 | Resp 16 | Ht 74.0 in | Wt 201.8 lb

## 2013-02-03 DIAGNOSIS — R012 Other cardiac sounds: Secondary | ICD-10-CM

## 2013-02-03 DIAGNOSIS — R002 Palpitations: Secondary | ICD-10-CM

## 2013-02-03 DIAGNOSIS — E7800 Pure hypercholesterolemia, unspecified: Secondary | ICD-10-CM

## 2013-02-03 MED ORDER — NEBIVOLOL HCL 5 MG PO TABS: 5.0000 mg | ORAL_TABLET | Freq: Every day | ORAL | Status: DC

## 2013-02-03 NOTE — Patient Instructions (Addendum)
Your physician has requested that you have an echocardiogram. Echocardiography is a painless test that uses sound waves to create images of your heart. It provides your doctor with information about the size and shape of your heart and how well your heart's chambers and valves are working. This procedure takes approximately one hour. There are no restrictions for this procedure.  Your physician has recommended you make the following change in your medication: Bystolic 5 mg as needed for palpitations  Your physician recommends that you schedule a follow-up appointment in: 1 year

## 2013-02-07 ENCOUNTER — Encounter: Payer: Self-pay | Admitting: Cardiovascular Disease

## 2013-02-14 ENCOUNTER — Telehealth (HOSPITAL_COMMUNITY): Payer: Self-pay | Admitting: Cardiovascular Disease

## 2013-02-14 DIAGNOSIS — E785 Hyperlipidemia, unspecified: Secondary | ICD-10-CM

## 2013-02-14 DIAGNOSIS — E782 Mixed hyperlipidemia: Secondary | ICD-10-CM

## 2013-02-14 NOTE — Telephone Encounter (Signed)
Pt says that Dr. Renne Crigler would like Dr.Croitoru to take a look at the cholesterol blood work results and make a recommendation for a cholesterol medication.  Please call patient

## 2013-02-14 NOTE — Telephone Encounter (Signed)
Per patient Dr. Renne Crigler wants Dr. Salena Saner to treat her cholesterol.  Will get an order from Dr. Salena Saner and call patient w/response.

## 2013-02-15 ENCOUNTER — Encounter: Payer: Self-pay | Admitting: Cardiovascular Disease

## 2013-02-15 DIAGNOSIS — R012 Other cardiac sounds: Secondary | ICD-10-CM | POA: Insufficient documentation

## 2013-02-15 DIAGNOSIS — E7800 Pure hypercholesterolemia, unspecified: Secondary | ICD-10-CM | POA: Insufficient documentation

## 2013-02-15 DIAGNOSIS — R002 Palpitations: Secondary | ICD-10-CM | POA: Insufficient documentation

## 2013-02-15 MED ORDER — PRAVASTATIN SODIUM 40 MG PO TABS: 40.0000 mg | ORAL_TABLET | Freq: Every evening | ORAL | Status: DC

## 2013-02-15 NOTE — Assessment & Plan Note (Signed)
Holter monitor shows PACs, no other significant abnormalities. Likely to be benign, especially since he notices they disappear during exercise. May be related to mitral valve prolapse. He does find them very bothersome and will try Bystolic 5 mg daily for palliation.

## 2013-02-15 NOTE — Telephone Encounter (Signed)
LM w/instructions to start Pravastatin 40mg  [facility] and recheck Lip[facility] in 3 months ( will mail order ).

## 2013-02-15 NOTE — Assessment & Plan Note (Signed)
Even for an individual without vascular disease, the LDL-C and the LDL particle number are high enough to warrant pharmacological therapy. Especially since he already has a pretty healthy diet and activity level. Recommend pravastatin 40 mg daily at bedtime and recheck in 3 months.

## 2013-02-15 NOTE — Progress Notes (Signed)
Patient ID: [name], male   DOB: 12-10-1965, 47 y.o.   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 He has had palpitations that are increasing in frequency, even after temporarily improving with caffeine reduction and increased sleep. They disappear during exercise. No other cardiac symptoms are associated with these palpitations.  Holter monitoring shows isolated PACs on a background of NSR. The time on the monitor is clearly erroneous, off by about 10 hours. The PACs are almost absent at night (low steady background heart rate) and increase during the day.  He has an unfavorable lipid profile (high LDL and LDL-P, albeit with excellent HDL) despite the fact that he exercises 2-3 x/week, is lean and eats a very heathy diet.    No Known Allergies  Current Outpatient Prescriptions  Medication Sig Dispense Refill  . aspirin EC 81 MG tablet Take 81 mg by mouth daily.      . finasteride (PROPECIA) 1 MG tablet Take 1 mg by mouth daily.      . Multiple Vitamin (MULTIVITAMIN) tablet Take 1 tablet by mouth daily.      . zaleplon (SONATA) 10 MG capsule Take 10 mg by mouth at bedtime as needed.      . nebivolol (BYSTOLIC) 5 MG tablet Take 1 tablet (5 mg total) by mouth daily.  28 tablet  0   No current facility-administered medications for this visit.    No past medical history on file.  No past surgical history on file.  No family history on file.  History   Social History  . Marital Status: Married    Spouse Name: N/A    Number of Children: N/A  . Years of Education: N/A   Occupational History  . Not on file.   Social History Main Topics  . Smoking status: Never Smoker   . Smokeless tobacco: Not on file  . Alcohol Use: No  . Drug Use: No  . Sexual Activity: Not on file   Other Topics Concern  . Not on file   Social History Narrative  . No narrative on file    Review of systems: The  patient specifically denies any chest pain at rest or with exertion, dyspnea at rest or with exertion, orthopnea, paroxysmal nocturnal dyspnea, syncope, focal neurological deficits, intermittent claudication, lower extremity edema, unexplained weight gain, cough, hemoptysis or wheezing.  The patient also denies abdominal pain, nausea, vomiting, dysphagia, diarrhea, constipation, polyuria, polydipsia, dysuria, hematuria, frequency, urgency, abnormal bleeding or bruising, fever, chills, unexpected weight changes, mood swings, change in skin or hair texture, change in voice quality, auditory or visual problems, allergic reactions or rashes, new musculoskeletal complaints other than usual "aches and pains".   PHYSICAL EXAM BP 110/80  Pulse 71  Resp 16  Ht 6\' 2"  (1.88 m)  Wt 201 lb 12.8 oz (91.536 kg)  BMI 25.9 kg/m2  General: Alert, oriented x3, no distress Head: no evidence of trauma, PERRL, EOMI, no exophtalmos or lid lag, no myxedema, no xanthelasma; normal ears, nose and oropharynx Neck: normal jugular venous pulsations and no hepatojugular reflux; brisk carotid pulses without delay and no carotid bruits Chest: clear to auscultation, no signs of consolidation by percussion or palpation, normal fremitus, symmetrical and full respiratory excursions Cardiovascular: normal position and quality of the apical impulse, regular rhythm, normal first and second heart sounds, no murmurs, rubs or gallops. There is a late systolic click,  augmented by Valsalva, abolished by handgrip, no murmur with Valsalva Abdomen: no tenderness or distention, no masses by palpation, no abnormal pulsatility or arterial bruits, normal bowel sounds, no hepatosplenomegaly Extremities: no clubbing, cyanosis or edema; 2+ radial, ulnar and brachial pulses bilaterally; 2+ right femoral, posterior tibial and dorsalis pedis pulses; 2+ left femoral, posterior tibial and dorsalis pedis pulses; no subclavian or femoral  bruits Neurological: grossly nonfocal   EKG: NSR  Lipid Panel  TC 235, TG 68, HDL 62, LDL 159 LDL-P 1907, small LDL 795, LDL size 21 Glucose 88  BMET    Component Value Date/Time   NA 142 12/15/2010 0500   K 4.6 12/15/2010 0500   CL 110 12/15/2010 0500   CO2 24 12/15/2010 0500   GLUCOSE 122* 12/15/2010 0500   BUN 7 12/15/2010 0500   CREATININE 0.75 12/15/2010 0500   CALCIUM 8.9 12/15/2010 0500   GFRNONAA >60 12/15/2010 0500   GFRAA >60 12/15/2010 0500     ASSESSMENT AND PLAN Palpitations Holter monitor shows PACs, no other significant abnormalities. Likely to be benign, especially since he notices they disappear during exercise. May be related to mitral valve prolapse. He does find them very bothersome and will try Bystolic 5 mg daily for palliation.  Abnormal heart sounds He has a distinct systolic click that changes with the Valsalva maneuver and handgrip in a pattern suggestive of mitral valve prolapse. Will check echo. I do not hear an accompanying murmur, even with provocative maneuvers.  Hypercholesteremia Even for an individual without vascular disease, the LDL-C and the LDL particle number are high enough to warrant pharmacological therapy. Especially since he already has a pretty healthy diet and activity level. Recommend pravastatin 40 mg daily at bedtime and recheck in 3 months.  Orders Placed This Encounter  Procedures  . EKG 12-Lead  . 2D Echocardiogram with contrast   Meds ordered this encounter  Medications  . zaleplon (SONATA) 10 MG capsule    Sig: Take 10 mg by mouth at bedtime as needed.  . finasteride (PROPECIA) 1 MG tablet    Sig: Take 1 mg by mouth daily.  . Multiple Vitamin (MULTIVITAMIN) tablet    Sig: Take 1 tablet by mouth daily.  Marland Kitchen aspirin EC 81 MG tablet    Sig: Take 81 mg by mouth daily.  . nebivolol (BYSTOLIC) 5 MG tablet    Sig: Take 1 tablet (5 mg total) by mouth daily.    Dispense:  28 tablet    Refill:  0    Quanda Pavlicek  Thurmon Fair, MD, [facility] [phone] office [phone] pager

## 2013-02-15 NOTE — Telephone Encounter (Signed)
I agree he needs Rx for his cholesterol. Recommend pravastatin 40 mg at bedtime daily and recheck a Liposcience panel in 3 months

## 2013-02-15 NOTE — Assessment & Plan Note (Signed)
He has a distinct systolic click that changes with the Valsalva maneuver and handgrip in a pattern suggestive of mitral valve prolapse. Will check echo. I do not hear an accompanying murmur, even with provocative maneuvers.

## 2013-02-16 ENCOUNTER — Ambulatory Visit (HOSPITAL_COMMUNITY)
Admission: RE | Admit: 2013-02-16 | Discharge: 2013-02-16 | Disposition: A | Discharge status: Home / Self Care | Payer: PRIVATE HEALTH INSURANCE | Source: Ambulatory Visit | Attending: Cardiovascular Disease | Admitting: Cardiovascular Disease

## 2013-02-16 DIAGNOSIS — R012 Other cardiac sounds: Secondary | ICD-10-CM

## 2013-02-16 DIAGNOSIS — R002 Palpitations: Secondary | ICD-10-CM | POA: Insufficient documentation

## 2013-02-16 NOTE — Progress Notes (Signed)
2D Echo Performed 02/16/2013    Demari Gales, RCS  

## 2013-02-17 ENCOUNTER — Telehealth: Payer: Self-pay | Admitting: Cardiovascular Disease

## 2013-02-17 NOTE — Telephone Encounter (Signed)
Report echo called

## 2013-02-21 ENCOUNTER — Telehealth: Payer: Self-pay | Admitting: *Deleted

## 2013-02-21 MED ORDER — PRAVASTATIN SODIUM 80 MG PO TABS: 40.0000 mg | ORAL_TABLET | Freq: Every evening | ORAL | Status: DC

## 2013-02-21 NOTE — Telephone Encounter (Signed)
Insurance will not approve pravastatin 40mg  qd. Approved 80mg  1/2 table qd is approved -  Sent to pharmacy.  Wife notified and voiced understanding.

## 2015-06-24 MED FILL — FINASTERIDE 5 MG TABLET: 5 | 30 days supply | Qty: 30 | Fill #2 | Status: TO

## 2015-06-24 MED FILL — PRAVASTATIN NA 40 MG TAB: 40 | 30 days supply | Qty: 30 | Fill #8 | Status: TO

## 2015-07-03 MED FILL — ZALEPLON 10 MG CAPSULE: 10 | 30 days supply | Qty: 30 | Fill #1 | Status: TO

## 2017-03-24 DIAGNOSIS — Z23 Encounter for immunization: Secondary | ICD-10-CM | POA: Diagnosis not present

## 2017-06-25 DIAGNOSIS — H524 Presbyopia: Secondary | ICD-10-CM | POA: Diagnosis not present

## 2017-10-20 ENCOUNTER — Encounter: Payer: Self-pay | Admitting: Internal Medicine

## 2017-11-23 DIAGNOSIS — Z0000 Encounter for general adult medical examination without abnormal findings: Secondary | ICD-10-CM | POA: Diagnosis not present

## 2017-11-23 DIAGNOSIS — Z125 Encounter for screening for malignant neoplasm of prostate: Secondary | ICD-10-CM | POA: Diagnosis not present

## 2018-03-30 DIAGNOSIS — Z23 Encounter for immunization: Secondary | ICD-10-CM | POA: Diagnosis not present

## 2019-01-26 DIAGNOSIS — Z0001 Encounter for general adult medical examination with abnormal findings: Secondary | ICD-10-CM | POA: Diagnosis not present

## 2019-02-02 DIAGNOSIS — G4726 Circadian rhythm sleep disorder, shift work type: Secondary | ICD-10-CM | POA: Diagnosis not present

## 2019-02-02 DIAGNOSIS — L649 Androgenic alopecia, unspecified: Secondary | ICD-10-CM | POA: Diagnosis not present

## 2019-02-02 DIAGNOSIS — Z0000 Encounter for general adult medical examination without abnormal findings: Secondary | ICD-10-CM | POA: Diagnosis not present

## 2019-02-02 DIAGNOSIS — E7800 Pure hypercholesterolemia, unspecified: Secondary | ICD-10-CM | POA: Diagnosis not present

## 2019-03-24 DIAGNOSIS — Z23 Encounter for immunization: Secondary | ICD-10-CM | POA: Diagnosis not present

## 2020-02-08 DIAGNOSIS — G4726 Circadian rhythm sleep disorder, shift work type: Secondary | ICD-10-CM | POA: Diagnosis not present

## 2020-02-08 DIAGNOSIS — R002 Palpitations: Secondary | ICD-10-CM | POA: Diagnosis not present

## 2020-02-08 DIAGNOSIS — R253 Fasciculation: Secondary | ICD-10-CM | POA: Diagnosis not present

## 2020-02-08 DIAGNOSIS — Z0000 Encounter for general adult medical examination without abnormal findings: Secondary | ICD-10-CM | POA: Diagnosis not present

## 2020-02-08 DIAGNOSIS — E7800 Pure hypercholesterolemia, unspecified: Secondary | ICD-10-CM | POA: Diagnosis not present

## 2020-02-08 DIAGNOSIS — K5900 Constipation, unspecified: Secondary | ICD-10-CM | POA: Diagnosis not present

## 2020-02-26 DIAGNOSIS — Z0000 Encounter for general adult medical examination without abnormal findings: Secondary | ICD-10-CM | POA: Diagnosis not present

## 2020-03-21 ENCOUNTER — Encounter: Payer: Self-pay | Admitting: Nurse Practitioner

## 2020-03-28 DIAGNOSIS — K5900 Constipation, unspecified: Secondary | ICD-10-CM

## 2020-03-28 DIAGNOSIS — Z23 Encounter for immunization: Secondary | ICD-10-CM | POA: Diagnosis not present

## 2020-04-08 ENCOUNTER — Ambulatory Visit (INDEPENDENT_AMBULATORY_CARE_PROVIDER_SITE_OTHER): Payer: BC Managed Care – PPO | Admitting: Nurse Practitioner

## 2020-04-08 ENCOUNTER — Encounter: Payer: Self-pay | Admitting: Nurse Practitioner

## 2020-04-08 VITALS — BP 122/64 | HR 88 | Ht 73.62 in | Wt 185.5 lb

## 2020-04-08 DIAGNOSIS — K5900 Constipation, unspecified: Secondary | ICD-10-CM | POA: Diagnosis not present

## 2020-04-08 DIAGNOSIS — R634 Abnormal weight loss: Secondary | ICD-10-CM

## 2020-04-08 MED ORDER — NA SULFATE-K SULFATE-MG SULF 17.5-3.13-1.6 GM/177ML PO SOLN: 1.0000 | Freq: Once | ORAL | 0 refills | Status: AC

## 2020-04-08 NOTE — Progress Notes (Addendum)
04/08/2020 [name] [id number] December 05, 1965   CHIEF COMPLAINT: Constipation   HISTORY OF PRESENT ILLNESS: [name] is a 54 year old male with a past medical history of hypercholesterolemia and chronic constipation.  He presents to our office today as referred by Dr. Deland Pretty for further evaluation regarding constipation and weight loss. Past deviated septum surgery and right leg surgery for nerve entrapment 2004. He reports having constipation for the past 4 years described as having a bowel movement every 3 days with straining.  However, over the past year his constipation has worsened.  He would go 4 to 6 days without passing a bowel movement.  He often felt as if a brick were in his lower abdomen.  He reduced his food intake to reduce his constipation symptoms.  He reported losing 20 pounds over the past year which he attributes to eating less.  No fever, sweats or chills.  He used various fiber/psyllium supplements, Colace, Dulcolax, Milk of Magnesia and suppositories without significant improvement.  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  He was started on Linzess 290 mcg 1 p.o. daily mid-September by Dr. Shelia Media which was life altering.  He is now passing normal formed brown bowel movement 5 to 6 days weekly.  No rectal bleeding or melena.  His lower abdominal "brick" discomfort has abated.  He has gained back 5 pounds since he started Linzess.  His appetite has improved.  His energy level has significantly improved as well.  He reported having routine laboratory studies done by his Dr. Shelia Media in 2023-03-01, I will request a copy of these results for further review.  He was admitted to the hospital 12/11/2010 with nausea, vomiting and epigastric pain.  An abdominal/pelvic CT scan 12/11/2010 showed an inflammatory process in the right lower quadrant with wall thickening of  the terminal ileum and surrounding inflammatory stranding and edema.  Mild proximal diffuse small bowel dilatation.  Changes representing infectious or inflammatory ileitis.  He underwent an EGD and colonoscopy by Dr. Olevia Perches 12/15/2010 which were normal.  He denies having any recurrence of similar nausea/vomiting or upper abdominal pain since this hospital admission. No known family history of IBD.  Social history: He is married.  He is an ICU physician.  He has 1 daughter.  He is a non-smoker.  He drinks 1/2 glass of wine most evenings.  No drug use.  Family History: Mother 21 history of breast cancer. Father and paternal family history unknown. He has 2 brothers, one with HTN. Maternal grandmother with history of breast cancer. No Known Allergies    Outpatient Encounter Medications as of 04/08/2020  Medication Sig  . aspirin EC 81 MG tablet Take 81 mg by mouth daily.  . finasteride (PROPECIA) 1 MG tablet Take 1 mg by mouth daily.  . hydrocortisone (PROCTOSOL HC) 2.5 % rectal cream Place 1 application rectally 2 (two) times daily.  Marland Kitchen linaclotide (LINZESS) 72 MCG capsule Take 72 mcg by mouth daily before breakfast.  . Magnesium 300 MG CAPS Take 1 capsule by mouth daily.  . melatonin 3 MG TABS tablet Take 3 mg by mouth at bedtime as needed.  . Multiple Vitamin (MULTIVITAMIN) tablet Take 1 tablet by mouth daily.  . Omega-3 Fatty Acids (FISH OIL) 500 MG CAPS Take 1 capsule by mouth daily.  . rosuvastatin (CRESTOR) 10 MG tablet Take 10 mg  by mouth daily.  . Valerian 500 MG CAPS Take 1 capsule by mouth at bedtime as needed.  . zaleplon (SONATA) 10 MG capsule Take 10 mg by mouth at bedtime as needed.   No facility-administered encounter medications on file as of 04/08/2020.     REVIEW OF SYSTEMS:   Gen: + weight loss. See HPI.  CV: Denies chest pain, palpitations or edema. Resp: Denies cough, shortness of breath of hemoptysis.  GI: See HPI.  GU : Denies urinary burning, blood in urine,  increased urinary frequency or incontinence. MS: Denies joint pain, muscles aches or weakness. Derm: Denies rash, itchiness, skin lesions or unhealing ulcers. Psych: Denies depression or anxiety.  Heme: Denies bruising, bleeding. Neuro:  Denies headaches, dizziness or paresthesias. Endo:  Denies any problems with DM, thyroid or adrenal function.   PHYSICAL EXAM: BP 122/64 (BP Location: Left Arm, Patient Position: Sitting, Cuff Size: Normal)   Pulse 88   Ht 6' 1.62" (1.87 m) Comment: height measured without shoes  Wt 185 lb 8 oz (84.1 kg)   BMI 24.06 kg/m   General: Well developed 54 year old male in no acute distress. Head: Normocephalic and atraumatic. Eyes:  Sclerae non-icteric, conjunctive pink. Ears: Normal auditory acuity. Mouth: Dentition intact. No ulcers or lesions.  Neck: Supple, no lymphadenopathy or thyromegaly.  Lungs: Clear bilaterally to auscultation without wheezes, crackles or rhonchi. Heart: Regular rate and rhythm. No murmur, rub or gallop appreciated.  Abdomen: Soft, nontender, non distended. No masses. No hepatosplenomegaly. Normoactive bowel sounds x 4 quadrants.  Rectal: Deferred.  Musculoskeletal: Symmetrical with no gross deformities. Skin: Warm and dry. No rash or lesions on visible extremities. Extremities: No edema. Neurological: Alert oriented x 4, no focal deficits.  Psychological:  Alert and cooperative. Normal mood and affect.  ASSESSMENT AND PLAN:  15.  54 year old male with chronic constipation which has significantly improved on Linzess 290 mcg daily, unfortunately this medication is nonformulary and the patient is paying out of pocket.  He wishes to try Trulance which is covered by his insurance. -Colonoscopy benefits and risks discussed including risk with sedation, risk of bleeding, perforation and infection  -Continue Linzess 290 mcg 1 p.o. daily until your samples run out -Samples of Trulance 3 mg 1 p.o. daily # 9 given to the patient.  He  will call our office if he elects to continue Trulance as he will need a prescription for this medication. -CBC, CMP and TSH results requested from PCP -Eventually check TTG and IgA, await the requested lab results prior to ordering any further laboratory studies -Patient to call our office if his abdominal pain or constipation recurs  2.  20 pound weight loss over the past year which the patient attributes to eating less to alleviate his constipation symptoms and abdominal pain -See plan in #1 -I discussed scheduling abdominal/pelvic CT scan for further evaluation for weight loss, however, at this time the patient declined CTAP.  However, if he continues to lose weight CTAP imaging would again be recommended  Addendum 05/03/2020: Labs dated 02/26/2020 received from PCP. Sodium 144.  Glucose 89.  BUN 11.  Creatinine 1.0.  Albumin 3.9.  Alk phos 44.  Potassium 4.6.  Calcium 9.3.  Total protein 6.3.  Total bili 0.30.  AST 15.  ALT 26.   02/08/2020: WBC 5.9.  Hemoglobin 15.8.  Hematocrit 47.3.  Platelet 263.    CC:  Deland Pretty, MD

## 2020-04-08 NOTE — Patient Instructions (Signed)
You have been scheduled for a colonoscopy. Please follow written instructions given to you at your visit today.  Please pick up your prep supplies at the pharmacy within the next 1-3 days. If you use inhalers (even only as needed), please bring them with you on the day of your procedure.  Due to recent changes in healthcare laws, you may see the results of your imaging and laboratory studies on MyChart before your provider has had a chance to review them.  We understand that in some cases there may be results that are confusing or concerning to you. Not all laboratory results come back in the same time frame and the provider may be waiting for multiple results in order to interpret others.  Please give [place] 48 hours in order for your provider to thoroughly review all the results before contacting the office for clarification of your results.   Normal BMI (Body Mass Index- based on height and weight) is between 19 and 25. Your BMI today is Body mass index is 24.06 kg/m. Marland Kitchen Please consider follow up  regarding your BMI with your Primary Care Provider.  We are going to request your labs from September with Dr Shelia Media.   Call the office if symptoms worsen.    We have given you samples of Trulance to try.   I appreciate the opportunity to care for you. Carl Best, CRNP

## 2020-04-30 NOTE — Progress Notes (Signed)
Reviewed and agree with documentation and assessment and plan. K. Veena Guliana Weyandt , MD   

## 2020-06-11 ENCOUNTER — Encounter: Payer: BC Managed Care – PPO | Admitting: Gastroenterology

## 2020-07-24 ENCOUNTER — Encounter: Payer: Self-pay | Admitting: Gastroenterology

## 2020-07-24 ENCOUNTER — Other Ambulatory Visit: Payer: Self-pay

## 2020-07-24 ENCOUNTER — Ambulatory Visit (AMBULATORY_SURGERY_CENTER): Payer: BC Managed Care – PPO | Admitting: Gastroenterology

## 2020-07-24 VITALS — BP 113/70 | HR 62 | Temp 97.5°F | Resp 9 | Ht 73.5 in | Wt 185.0 lb

## 2020-07-24 DIAGNOSIS — K5900 Constipation, unspecified: Secondary | ICD-10-CM | POA: Diagnosis not present

## 2020-07-24 DIAGNOSIS — Z538 Procedure and treatment not carried out for other reasons: Secondary | ICD-10-CM

## 2020-07-24 DIAGNOSIS — Z1211 Encounter for screening for malignant neoplasm of colon: Secondary | ICD-10-CM | POA: Diagnosis not present

## 2020-07-24 MED ORDER — SODIUM CHLORIDE 0.9 % IV SOLN: 500.0000 mL | Freq: Once | INTRAVENOUS | Status: DC

## 2020-07-24 NOTE — Op Note (Signed)
[facility] Patient Name: [name] Procedure Date: 07/24/2020 3:25 PM MRN: [id number] Endoscopist: Mauri Pole , MD Age: 55 Referring MD:  Date of Birth: 1966/05/07 Gender: Male Account #: [id number] Procedure:                Colonoscopy Indications:              Screening for colorectal malignant neoplasm Medicines:                Monitored Anesthesia Care Procedure:                Pre-Anesthesia Assessment:                           - Prior to the procedure, a History and Physical                            was performed, and patient medications and                            allergies were reviewed. The patient's tolerance of                            previous anesthesia was also reviewed. The risks                            and benefits of the procedure and the sedation                            options and risks were discussed with the patient.                            All questions were answered, and informed consent                            was obtained. Prior Anticoagulants: The patient has                            taken no previous anticoagulant or antiplatelet                            agents. ASA Grade Assessment: II - A patient with                            mild systemic disease. After reviewing the risks                            and benefits, the patient was deemed in                            satisfactory condition to undergo the procedure.                           After obtaining informed consent, the colonoscope  was passed under direct vision. Throughout the                            procedure, the patient's blood pressure, pulse, and                            oxygen saturations were monitored continuously. The                            Olympus PCF-H190DL (#[id number]) Colonoscope was                            introduced through the anus and advanced to the the                            cecum, identified  by appendiceal orifice and                            ileocecal valve. The colonoscopy was performed                            without difficulty. The patient tolerated the                            procedure well. The quality of the bowel                            preparation was inadequate. The ileocecal valve,                            appendiceal orifice, and rectum were photographed. Scope In: 3:44:28 PM Scope Out: 3:58:00 PM Scope Withdrawal Time: 0 hours 7 minutes 54 seconds  Total Procedure Duration: 0 hours 13 minutes 32 seconds  Findings:                 The perianal and digital rectal examinations were                            normal.                           A moderate amount of stool was found in the                            transverse colon, in the ascending colon and in the                            cecum, interfering with visualization. Lavage of                            the area was performed using a large amount,                            resulting in incomplete clearance with continued  poor visualization. Prep is not adequate to detect                            polyps or flat lesions.                           Non-bleeding internal hemorrhoids were found during                            retroflexion. The hemorrhoids were small. Complications:            No immediate complications. Estimated Blood Loss:     Estimated blood loss was minimal. Impression:               - Preparation of the colon was inadequate.                           - Stool in the transverse colon, in the ascending                            colon and in the cecum. Prep is not adequate to                            detect polyps or flat lesions in the Right colon.                           - Non-bleeding internal hemorrhoids.                           - No specimens collected. Recommendation:           - Patient has a contact number available for                             emergencies. The signs and symptoms of potential                            delayed complications were discussed with the                            patient. Return to normal activities tomorrow.                            Written discharge instructions were provided to the                            patient.                           - Resume previous diet.                           - Continue present medications.                           - Repeat colonoscopy at the next available  appointment because the bowel preparation was                            suboptimal. Mauri Pole, MD 07/24/2020 4:04:46 PM This report has been signed electronically.

## 2020-07-24 NOTE — Progress Notes (Signed)
PT taken to PACU. Monitors in place. VSS. Report given to RN. 

## 2020-07-24 NOTE — Patient Instructions (Signed)
Discharge instructions given. Handout on Hemorrhoids. Resume previous medication. Patient to return tomorrow for repeat Colonoscopy. Instructions to be given. YOU HAD AN ENDOSCOPIC PROCEDURE TODAY AT [facility]:   Refer to the procedure report that was given to you for any specific questions about what was found during the examination.  If the procedure report does not answer your questions, please call your gastroenterologist to clarify.  If you requested that your care partner not be given the details of your procedure findings, then the procedure report has been included in a sealed envelope for you to review at your convenience later.  YOU SHOULD EXPECT: Some feelings of bloating in the abdomen. Passage of more gas than usual.  Walking can help get rid of the air that was put into your GI tract during the procedure and reduce the bloating. If you had a lower endoscopy (such as a colonoscopy or flexible sigmoidoscopy) you may notice spotting of blood in your stool or on the toilet paper. If you underwent a bowel prep for your procedure, you may not have a normal bowel movement for a few days.  Please Note:  You might notice some irritation and congestion in your nose or some drainage.  This is from the oxygen used during your procedure.  There is no need for concern and it should clear up in a day or so.  SYMPTOMS TO REPORT IMMEDIATELY:   Following lower endoscopy (colonoscopy or flexible sigmoidoscopy):  Excessive amounts of blood in the stool  Significant tenderness or worsening of abdominal pains  Swelling of the abdomen that is new, acute  Fever of 100F or higher   For urgent or emergent issues, a gastroenterologist can be reached at any hour by calling [phone]. Do not use MyChart messaging for urgent concerns.    DIET:  We do recommend a small meal at first, but then you may proceed to your regular diet.  Drink plenty of fluids but you should avoid alcoholic  beverages for 24 hours.  ACTIVITY:  You should plan to take it easy for the rest of today and you should NOT DRIVE or use heavy machinery until tomorrow (because of the sedation medicines used during the test).    FOLLOW UP: Our staff will call the number listed on your records 48-72 hours following your procedure to check on you and address any questions or concerns that you may have regarding the information given to you following your procedure. If we do not reach you, we will leave a message.  We will attempt to reach you two times.  During this call, we will ask if you have developed any symptoms of COVID 19. If you develop any symptoms (ie: fever, flu-like symptoms, shortness of breath, cough etc.) before then, please call [phone].  If you test positive for Covid 19 in the 2 weeks post procedure, please call and report this information to [place].    If any biopsies were taken you will be contacted by phone or by letter within the next 1-3 weeks.  Please call us at [phone] if you have not heard about the biopsies in 3 weeks.    SIGNATURES/CONFIDENTIALITY: You and/or your care partner have signed paperwork which will be entered into your electronic medical record.  These signatures attest to the fact that that the information above on your After Visit Summary has been reviewed and is understood.  Full responsibility of the confidentiality of this discharge information lies with you and/or  your care-partner. 

## 2020-07-25 ENCOUNTER — Ambulatory Visit (AMBULATORY_SURGERY_CENTER): Payer: BC Managed Care – PPO | Admitting: Gastroenterology

## 2020-07-25 ENCOUNTER — Encounter: Payer: Self-pay | Admitting: Gastroenterology

## 2020-07-25 VITALS — BP 110/72 | HR 65 | Temp 97.7°F | Resp 14 | Ht 73.0 in | Wt 185.0 lb

## 2020-07-25 DIAGNOSIS — Z1211 Encounter for screening for malignant neoplasm of colon: Secondary | ICD-10-CM | POA: Diagnosis not present

## 2020-07-25 DIAGNOSIS — K5900 Constipation, unspecified: Secondary | ICD-10-CM

## 2020-07-25 DIAGNOSIS — D123 Benign neoplasm of transverse colon: Secondary | ICD-10-CM

## 2020-07-25 MED ORDER — SODIUM CHLORIDE 0.9 % IV SOLN: 500.0000 mL | Freq: Once | INTRAVENOUS | Status: DC

## 2020-07-25 NOTE — Patient Instructions (Signed)
YOU HAD AN ENDOSCOPIC PROCEDURE TODAY AT THE [facility]:   Refer to the procedure report that was given to you for any specific questions about what was found during the examination.  If the procedure report does not answer your questions, please call your gastroenterologist to clarify.  If you requested that your care partner not be given the details of your procedure findings, then the procedure report has been included in a sealed envelope for you to review at your convenience later.  YOU SHOULD EXPECT: Some feelings of bloating in the abdomen. Passage of more gas than usual.  Walking can help get rid of the air that was put into your GI tract during the procedure and reduce the bloating. If you had a lower endoscopy (such as a colonoscopy or flexible sigmoidoscopy) you may notice spotting of blood in your stool or on the toilet paper. If you underwent a bowel prep for your procedure, you may not have a normal bowel movement for a few days.  **Handout given on polyps**  Please Note:  You might notice some irritation and congestion in your nose or some drainage.  This is from the oxygen used during your procedure.  There is no need for concern and it should clear up in a day or so.  SYMPTOMS TO REPORT IMMEDIATELY:   Following lower endoscopy (colonoscopy or flexible sigmoidoscopy):  Excessive amounts of blood in the stool  Significant tenderness or worsening of abdominal pains  Swelling of the abdomen that is new, acute  Fever of 100F or higher    For urgent or emergent issues, a gastroenterologist can be reached at any hour by calling [phone]. Do not use MyChart messaging for urgent concerns.    DIET:  We do recommend a small meal at first, but then you may proceed to your regular diet.  Drink plenty of fluids but you should avoid alcoholic beverages for 24 hours.  ACTIVITY:  You should plan to take it easy for the rest of today and you should NOT DRIVE or use heavy  machinery until tomorrow (because of the sedation medicines used during the test).    FOLLOW UP: Our staff will call the number listed on your records 48-72 hours following your procedure to check on you and address any questions or concerns that you may have regarding the information given to you following your procedure. If we do not reach you, we will leave a message.  We will attempt to reach you two times.  During this call, we will ask if you have developed any symptoms of COVID 19. If you develop any symptoms (ie: fever, flu-like symptoms, shortness of breath, cough etc.) before then, please call [phone].  If you test positive for Covid 19 in the 2 weeks post procedure, please call and report this information to us.    If any biopsies were taken you will be contacted by phone or by letter within the next 1-3 weeks.  Please call us at [phone] if you have not heard about the biopsies in 3 weeks.    SIGNATURES/CONFIDENTIALITY: You and/or your care partner have signed paperwork which will be entered into your electronic medical record.  These signatures attest to the fact that that the information above on your After Visit Summary has been reviewed and is understood.  Full responsibility of the confidentiality of this discharge information lies with you and/or your care-partner. 

## 2020-07-25 NOTE — Progress Notes (Signed)
[id number]  Pt reports no changes from medical history reviewed on previous day.

## 2020-07-25 NOTE — Progress Notes (Signed)
Called to room to assist during endoscopic procedure.  Patient ID and intended procedure confirmed with present staff. Received instructions for my participation in the procedure from the performing physician.  

## 2020-07-25 NOTE — Progress Notes (Signed)
pt tolerated well. VSS. awake and to recovery. Report given to RN.  

## 2020-07-25 NOTE — Op Note (Signed)
[facility] Patient Name: [name] Procedure Date: 07/25/2020 12:00 PM MRN: [id number] Endoscopist: Mauri Pole , MD Age: 55 Referring MD:  Date of Birth: 22-Jul-1965 Gender: Male Account #: [id number] Procedure:                Colonoscopy Indications:              Screening for colorectal malignant neoplasm,                            Incidental constipation noted. Inadequate bowel                            prep on prior colonoscopy. Medicines:                Monitored Anesthesia Care Procedure:                Pre-Anesthesia Assessment:                           - Prior to the procedure, a History and Physical                            was performed, and patient medications and                            allergies were reviewed. The patient's tolerance of                            previous anesthesia was also reviewed. The risks                            and benefits of the procedure and the sedation                            options and risks were discussed with the patient.                            All questions were answered, and informed consent                            was obtained. Prior Anticoagulants: The patient has                            taken no previous anticoagulant or antiplatelet                            agents. ASA Grade Assessment: II - A patient with                            mild systemic disease. After reviewing the risks                            and benefits, the patient was deemed in  satisfactory condition to undergo the procedure.                           After obtaining informed consent, the colonoscope                            was passed under direct vision. Throughout the                            procedure, the patient's blood pressure, pulse, and                            oxygen saturations were monitored continuously. The                            Olympus PCF-H190DL ([id number])  Colonoscope was                            introduced through the anus and advanced to the the                            terminal ileum, with identification of the                            appendiceal orifice and IC valve. The colonoscopy                            was performed without difficulty. The patient                            tolerated the procedure well. The quality of the                            bowel preparation was adequate. The ileocecal                            valve, appendiceal orifice, and rectum were                            photographed. Scope In: 12:11:23 PM Scope Out: 12:33:04 PM Scope Withdrawal Time: 0 hours 14 minutes 41 seconds  Total Procedure Duration: 0 hours 21 minutes 41 seconds  Findings:                 The perianal and digital rectal examinations were                            normal.                           A 7 mm polyp was found in the transverse colon. The                            polyp was sessile. The polyp was removed with a  cold snare. Resection and retrieval were complete.                           Non-bleeding external and internal hemorrhoids were                            found during retroflexion. The hemorrhoids were                            medium-sized.                           The exam was otherwise without abnormality.                           The terminal ileum appeared normal. Complications:            No immediate complications. Estimated Blood Loss:     Estimated blood loss was minimal. Impression:               - One 7 mm polyp in the transverse colon, removed                            with a cold snare. Resected and retrieved.                           - Non-bleeding external and internal hemorrhoids.                           - The examination was otherwise normal.                           - The examined portion of the ileum was normal. Recommendation:           - Patient has a  contact number available for                            emergencies. The signs and symptoms of potential                            delayed complications were discussed with the                            patient. Return to normal activities tomorrow.                            Written discharge instructions were provided to the                            patient.                           - Resume previous diet.                           - Continue present medications.                           -  Await pathology results.                           - Repeat colonoscopy in 5-10 years for surveillance                            based on pathology results.                           - Use Benefiber two teaspoons PO BID.                           - Continue Linzess 260mcg daily Mauri Pole, MD 07/25/2020 12:40:52 PM This report has been signed electronically.

## 2020-07-29 ENCOUNTER — Telehealth: Payer: Self-pay | Admitting: *Deleted

## 2020-07-29 ENCOUNTER — Telehealth: Payer: Self-pay

## 2020-07-29 NOTE — Telephone Encounter (Signed)
  Follow up Call-  Call back number 07/25/2020 07/24/2020  Post procedure Call Back phone  # [phone]  Permission to leave phone message No No  comments no voicemail no VM set up  Some recent data might be hidden     No answer

## 2020-07-29 NOTE — Telephone Encounter (Signed)
Second attempt, no answer and unable to leave voicemail.  °

## 2020-08-06 ENCOUNTER — Encounter: Payer: Self-pay | Admitting: Gastroenterology

## 2021-01-28 DIAGNOSIS — M5441 Lumbago with sciatica, right side: Secondary | ICD-10-CM | POA: Diagnosis not present

## 2021-02-04 DIAGNOSIS — M9903 Segmental and somatic dysfunction of lumbar region: Secondary | ICD-10-CM | POA: Diagnosis not present

## 2021-02-04 DIAGNOSIS — M9902 Segmental and somatic dysfunction of thoracic region: Secondary | ICD-10-CM | POA: Diagnosis not present

## 2021-02-04 DIAGNOSIS — M5441 Lumbago with sciatica, right side: Secondary | ICD-10-CM | POA: Diagnosis not present

## 2021-02-04 DIAGNOSIS — M62830 Muscle spasm of back: Secondary | ICD-10-CM | POA: Diagnosis not present

## 2021-02-05 DIAGNOSIS — M5441 Lumbago with sciatica, right side: Secondary | ICD-10-CM | POA: Diagnosis not present

## 2021-02-05 DIAGNOSIS — M9903 Segmental and somatic dysfunction of lumbar region: Secondary | ICD-10-CM | POA: Diagnosis not present

## 2021-02-05 DIAGNOSIS — M9902 Segmental and somatic dysfunction of thoracic region: Secondary | ICD-10-CM | POA: Diagnosis not present

## 2021-02-05 DIAGNOSIS — Z125 Encounter for screening for malignant neoplasm of prostate: Secondary | ICD-10-CM | POA: Diagnosis not present

## 2021-02-05 DIAGNOSIS — M62830 Muscle spasm of back: Secondary | ICD-10-CM | POA: Diagnosis not present

## 2021-02-05 DIAGNOSIS — Z0000 Encounter for general adult medical examination without abnormal findings: Secondary | ICD-10-CM | POA: Diagnosis not present

## 2021-02-06 DIAGNOSIS — M62830 Muscle spasm of back: Secondary | ICD-10-CM | POA: Diagnosis not present

## 2021-02-06 DIAGNOSIS — M9903 Segmental and somatic dysfunction of lumbar region: Secondary | ICD-10-CM | POA: Diagnosis not present

## 2021-02-06 DIAGNOSIS — M5441 Lumbago with sciatica, right side: Secondary | ICD-10-CM | POA: Diagnosis not present

## 2021-02-06 DIAGNOSIS — M9902 Segmental and somatic dysfunction of thoracic region: Secondary | ICD-10-CM | POA: Diagnosis not present

## 2021-02-07 DIAGNOSIS — M5441 Lumbago with sciatica, right side: Secondary | ICD-10-CM | POA: Diagnosis not present

## 2021-02-07 DIAGNOSIS — M9903 Segmental and somatic dysfunction of lumbar region: Secondary | ICD-10-CM | POA: Diagnosis not present

## 2021-02-07 DIAGNOSIS — M62830 Muscle spasm of back: Secondary | ICD-10-CM | POA: Diagnosis not present

## 2021-02-07 DIAGNOSIS — M9902 Segmental and somatic dysfunction of thoracic region: Secondary | ICD-10-CM | POA: Diagnosis not present

## 2021-02-17 DIAGNOSIS — M9903 Segmental and somatic dysfunction of lumbar region: Secondary | ICD-10-CM | POA: Diagnosis not present

## 2021-02-17 DIAGNOSIS — M5441 Lumbago with sciatica, right side: Secondary | ICD-10-CM | POA: Diagnosis not present

## 2021-02-17 DIAGNOSIS — M9902 Segmental and somatic dysfunction of thoracic region: Secondary | ICD-10-CM | POA: Diagnosis not present

## 2021-02-17 DIAGNOSIS — M62830 Muscle spasm of back: Secondary | ICD-10-CM | POA: Diagnosis not present

## 2021-02-18 DIAGNOSIS — K5904 Chronic idiopathic constipation: Secondary | ICD-10-CM | POA: Diagnosis not present

## 2021-02-18 DIAGNOSIS — M9903 Segmental and somatic dysfunction of lumbar region: Secondary | ICD-10-CM | POA: Diagnosis not present

## 2021-02-18 DIAGNOSIS — M9902 Segmental and somatic dysfunction of thoracic region: Secondary | ICD-10-CM | POA: Diagnosis not present

## 2021-02-18 DIAGNOSIS — E7800 Pure hypercholesterolemia, unspecified: Secondary | ICD-10-CM | POA: Diagnosis not present

## 2021-02-18 DIAGNOSIS — M62830 Muscle spasm of back: Secondary | ICD-10-CM | POA: Diagnosis not present

## 2021-02-18 DIAGNOSIS — Z0000 Encounter for general adult medical examination without abnormal findings: Secondary | ICD-10-CM | POA: Diagnosis not present

## 2021-02-18 DIAGNOSIS — M5441 Lumbago with sciatica, right side: Secondary | ICD-10-CM | POA: Diagnosis not present

## 2021-02-18 DIAGNOSIS — G4726 Circadian rhythm sleep disorder, shift work type: Secondary | ICD-10-CM | POA: Diagnosis not present

## 2021-02-19 DIAGNOSIS — M9903 Segmental and somatic dysfunction of lumbar region: Secondary | ICD-10-CM | POA: Diagnosis not present

## 2021-02-19 DIAGNOSIS — M5441 Lumbago with sciatica, right side: Secondary | ICD-10-CM | POA: Diagnosis not present

## 2021-02-19 DIAGNOSIS — M62830 Muscle spasm of back: Secondary | ICD-10-CM | POA: Diagnosis not present

## 2021-02-19 DIAGNOSIS — M9902 Segmental and somatic dysfunction of thoracic region: Secondary | ICD-10-CM | POA: Diagnosis not present

## 2021-02-21 DIAGNOSIS — M9902 Segmental and somatic dysfunction of thoracic region: Secondary | ICD-10-CM | POA: Diagnosis not present

## 2021-02-21 DIAGNOSIS — M5441 Lumbago with sciatica, right side: Secondary | ICD-10-CM | POA: Diagnosis not present

## 2021-02-21 DIAGNOSIS — M62830 Muscle spasm of back: Secondary | ICD-10-CM | POA: Diagnosis not present

## 2021-02-21 DIAGNOSIS — M9903 Segmental and somatic dysfunction of lumbar region: Secondary | ICD-10-CM | POA: Diagnosis not present

## 2021-03-06 DIAGNOSIS — M9902 Segmental and somatic dysfunction of thoracic region: Secondary | ICD-10-CM | POA: Diagnosis not present

## 2021-03-06 DIAGNOSIS — M62830 Muscle spasm of back: Secondary | ICD-10-CM | POA: Diagnosis not present

## 2021-03-06 DIAGNOSIS — M9903 Segmental and somatic dysfunction of lumbar region: Secondary | ICD-10-CM | POA: Diagnosis not present

## 2021-03-06 DIAGNOSIS — M5441 Lumbago with sciatica, right side: Secondary | ICD-10-CM | POA: Diagnosis not present

## 2021-03-07 DIAGNOSIS — M62830 Muscle spasm of back: Secondary | ICD-10-CM | POA: Diagnosis not present

## 2021-03-07 DIAGNOSIS — M9903 Segmental and somatic dysfunction of lumbar region: Secondary | ICD-10-CM | POA: Diagnosis not present

## 2021-03-07 DIAGNOSIS — M9902 Segmental and somatic dysfunction of thoracic region: Secondary | ICD-10-CM | POA: Diagnosis not present

## 2021-03-07 DIAGNOSIS — M5441 Lumbago with sciatica, right side: Secondary | ICD-10-CM | POA: Diagnosis not present

## 2021-03-14 DIAGNOSIS — M62830 Muscle spasm of back: Secondary | ICD-10-CM | POA: Diagnosis not present

## 2021-03-14 DIAGNOSIS — M5441 Lumbago with sciatica, right side: Secondary | ICD-10-CM | POA: Diagnosis not present

## 2021-03-14 DIAGNOSIS — M9902 Segmental and somatic dysfunction of thoracic region: Secondary | ICD-10-CM | POA: Diagnosis not present

## 2021-03-14 DIAGNOSIS — M9903 Segmental and somatic dysfunction of lumbar region: Secondary | ICD-10-CM | POA: Diagnosis not present

## 2021-03-19 DIAGNOSIS — M9902 Segmental and somatic dysfunction of thoracic region: Secondary | ICD-10-CM | POA: Diagnosis not present

## 2021-03-19 DIAGNOSIS — M62830 Muscle spasm of back: Secondary | ICD-10-CM | POA: Diagnosis not present

## 2021-03-19 DIAGNOSIS — M5441 Lumbago with sciatica, right side: Secondary | ICD-10-CM | POA: Diagnosis not present

## 2021-03-19 DIAGNOSIS — M9903 Segmental and somatic dysfunction of lumbar region: Secondary | ICD-10-CM | POA: Diagnosis not present

## 2021-03-26 DIAGNOSIS — M9903 Segmental and somatic dysfunction of lumbar region: Secondary | ICD-10-CM | POA: Diagnosis not present

## 2021-03-26 DIAGNOSIS — M62830 Muscle spasm of back: Secondary | ICD-10-CM | POA: Diagnosis not present

## 2021-03-26 DIAGNOSIS — M9902 Segmental and somatic dysfunction of thoracic region: Secondary | ICD-10-CM | POA: Diagnosis not present

## 2021-03-26 DIAGNOSIS — M5441 Lumbago with sciatica, right side: Secondary | ICD-10-CM | POA: Diagnosis not present

## 2021-03-27 DIAGNOSIS — Z23 Encounter for immunization: Secondary | ICD-10-CM | POA: Diagnosis not present

## 2021-10-29 DIAGNOSIS — M5441 Lumbago with sciatica, right side: Secondary | ICD-10-CM | POA: Diagnosis not present

## 2021-10-29 DIAGNOSIS — M9903 Segmental and somatic dysfunction of lumbar region: Secondary | ICD-10-CM | POA: Diagnosis not present

## 2021-10-31 DIAGNOSIS — M9903 Segmental and somatic dysfunction of lumbar region: Secondary | ICD-10-CM | POA: Diagnosis not present

## 2021-10-31 DIAGNOSIS — M5441 Lumbago with sciatica, right side: Secondary | ICD-10-CM | POA: Diagnosis not present

## 2021-11-05 DIAGNOSIS — M5441 Lumbago with sciatica, right side: Secondary | ICD-10-CM | POA: Diagnosis not present

## 2021-11-05 DIAGNOSIS — M9903 Segmental and somatic dysfunction of lumbar region: Secondary | ICD-10-CM | POA: Diagnosis not present

## 2021-11-07 DIAGNOSIS — M5441 Lumbago with sciatica, right side: Secondary | ICD-10-CM | POA: Diagnosis not present

## 2021-11-07 DIAGNOSIS — M9903 Segmental and somatic dysfunction of lumbar region: Secondary | ICD-10-CM | POA: Diagnosis not present

## 2021-11-12 DIAGNOSIS — M5441 Lumbago with sciatica, right side: Secondary | ICD-10-CM | POA: Diagnosis not present

## 2021-11-12 DIAGNOSIS — M9903 Segmental and somatic dysfunction of lumbar region: Secondary | ICD-10-CM | POA: Diagnosis not present

## 2021-11-14 DIAGNOSIS — M5441 Lumbago with sciatica, right side: Secondary | ICD-10-CM | POA: Diagnosis not present

## 2021-11-14 DIAGNOSIS — M9903 Segmental and somatic dysfunction of lumbar region: Secondary | ICD-10-CM | POA: Diagnosis not present

## 2022-01-08 DIAGNOSIS — Z03818 Encounter for observation for suspected exposure to other biological agents ruled out: Secondary | ICD-10-CM | POA: Diagnosis not present

## 2022-01-08 DIAGNOSIS — J020 Streptococcal pharyngitis: Secondary | ICD-10-CM | POA: Diagnosis not present

## 2022-01-08 DIAGNOSIS — J029 Acute pharyngitis, unspecified: Secondary | ICD-10-CM | POA: Diagnosis not present

## 2022-01-08 DIAGNOSIS — Z681 Body mass index (BMI) 19 or less, adult: Secondary | ICD-10-CM | POA: Diagnosis not present

## 2022-01-08 DIAGNOSIS — R509 Fever, unspecified: Secondary | ICD-10-CM | POA: Diagnosis not present

## 2022-02-16 DIAGNOSIS — Z0000 Encounter for general adult medical examination without abnormal findings: Secondary | ICD-10-CM | POA: Diagnosis not present

## 2022-02-16 DIAGNOSIS — Z125 Encounter for screening for malignant neoplasm of prostate: Secondary | ICD-10-CM | POA: Diagnosis not present

## 2022-02-19 DIAGNOSIS — E7800 Pure hypercholesterolemia, unspecified: Secondary | ICD-10-CM | POA: Diagnosis not present

## 2022-02-19 DIAGNOSIS — M5441 Lumbago with sciatica, right side: Secondary | ICD-10-CM | POA: Diagnosis not present

## 2022-02-19 DIAGNOSIS — Z0000 Encounter for general adult medical examination without abnormal findings: Secondary | ICD-10-CM | POA: Diagnosis not present

## 2022-02-19 DIAGNOSIS — Z23 Encounter for immunization: Secondary | ICD-10-CM | POA: Diagnosis not present

## 2022-02-19 DIAGNOSIS — L649 Androgenic alopecia, unspecified: Secondary | ICD-10-CM | POA: Diagnosis not present

## 2022-02-19 DIAGNOSIS — K5904 Chronic idiopathic constipation: Secondary | ICD-10-CM | POA: Diagnosis not present

## 2022-03-04 DIAGNOSIS — M5431 Sciatica, right side: Secondary | ICD-10-CM | POA: Diagnosis not present

## 2022-03-16 DIAGNOSIS — M5416 Radiculopathy, lumbar region: Secondary | ICD-10-CM | POA: Diagnosis not present

## 2022-03-16 DIAGNOSIS — M5117 Intervertebral disc disorders with radiculopathy, lumbosacral region: Secondary | ICD-10-CM | POA: Diagnosis not present

## 2022-03-18 DIAGNOSIS — M5137 Other intervertebral disc degeneration, lumbosacral region: Secondary | ICD-10-CM | POA: Diagnosis not present

## 2022-03-18 DIAGNOSIS — M5136 Other intervertebral disc degeneration, lumbar region: Secondary | ICD-10-CM | POA: Diagnosis not present

## 2022-03-18 DIAGNOSIS — Z23 Encounter for immunization: Secondary | ICD-10-CM | POA: Diagnosis not present

## 2022-03-20 DIAGNOSIS — M5137 Other intervertebral disc degeneration, lumbosacral region: Secondary | ICD-10-CM | POA: Diagnosis not present

## 2022-03-20 DIAGNOSIS — M5136 Other intervertebral disc degeneration, lumbar region: Secondary | ICD-10-CM | POA: Diagnosis not present

## 2022-06-08 DIAGNOSIS — Z23 Encounter for immunization: Secondary | ICD-10-CM | POA: Diagnosis not present

## 2022-06-09 ENCOUNTER — Other Ambulatory Visit (HOSPITAL_COMMUNITY): Payer: Self-pay

## 2022-07-07 DIAGNOSIS — D225 Melanocytic nevi of trunk: Secondary | ICD-10-CM | POA: Diagnosis not present

## 2022-07-07 DIAGNOSIS — L820 Inflamed seborrheic keratosis: Secondary | ICD-10-CM | POA: Diagnosis not present

## 2022-11-27 ENCOUNTER — Other Ambulatory Visit: Payer: Self-pay | Admitting: Surgery

## 2022-11-27 DIAGNOSIS — K4020 Bilateral inguinal hernia, without obstruction or gangrene, not specified as recurrent: Secondary | ICD-10-CM | POA: Diagnosis not present

## 2023-01-06 NOTE — Progress Notes (Addendum)
Surgical Instructions    Your procedure is scheduled on August 22,2024.  Report to [facility] Main Entrance "A" at 5:30 A.M., then check in with the Admitting office.  Call this number if you have problems the morning of surgery:  [phone]  If you have any questions prior to your surgery date call [phone]: Open Monday-Friday 8am-4pm If you experience any cold or flu symptoms such as cough, fever, chills, shortness of breath, etc. between now and your scheduled surgery, please notify us at the above number.     Remember:  Do not eat after midnight the night before your surgery  You may drink clear liquids until 4:30 A.M. the morning of your surgery.   Clear liquids allowed are: Water, Non-Citrus Juices (without pulp), Carbonated Beverages, Clear Tea, Black Coffee Only (NO MILK, CREAM OR POWDERED CREAMER of any kind), and Gatorade.   Patient Instructions  The night before surgery:  No food after midnight. ONLY clear liquids after midnight  The day of surgery (if you do NOT have diabetes):  Drink ONE (1) Pre-Surgery Clear Ensure by 4:30 A.M. the morning of surgery. Drink in one sitting. Do not sip.  This drink was given to you during your hospital  pre-op appointment visit.  Nothing else to drink after completing the  Pre-Surgery Clear Ensure.  The day of surgery (if you have diabetes): Drink ONE (1) 12 oz G2 given to you in your pre admission testing appointment by  the morning of surgery. Drink in one sitting. Do not sip.  This drink was given to you during your hospital  pre-op appointment visit.  Nothing else to drink after completing the  12 oz bottle of G2.         If you have questions, please contact your surgeon's office.        Take these medicines the morning of surgery with A SIP OF WATER  finasteride (PROPECIA)     As of today, STOP taking any Aspirin (unless otherwise instructed by your surgeon) Aleve, Naproxen, Ibuprofen, Motrin, Advil, Goody's,  BC's, all herbal medications, fish oil, and all vitamins. This includes meloxicam (MOBIC) . Omega-3 Fatty Acids Last dose 01-14-2023                     Do NOT Smoke (Tobacco/Vaping) for 24 hours prior to your procedure.  If you use a CPAP at night, you may bring your mask/headgear for your overnight stay.   Contacts, glasses, piercing's, hearing aid's, dentures or partials may not be worn into surgery, please bring cases for these belongings.    For patients admitted to the hospital, discharge time will be determined by your treatment team.   Patients discharged the day of surgery will not be allowed to drive home, and someone needs to stay with them for 24 hours.  SURGICAL WAITING ROOM VISITATION Patients having surgery or a procedure may have no more than 2 support people in the waiting area - these visitors may rotate.   Children under the age of 38 must have an adult with them who is not the patient. If the patient needs to stay at the hospital during part of their recovery, the visitor guidelines for inpatient rooms apply. Pre-op nurse will coordinate an appropriate time for 1 support person to accompany patient in pre-op.  This support person may not rotate.   Please refer to the [facility] website for the visitor guidelines for Inpatients (after your surgery is over and you are  in a regular room).    Special instructions:   [place]- Preparing For Surgery  Before surgery, you can play an important role. Because skin is not sterile, your skin needs to be as free of germs as possible. You can reduce the number of germs on your skin by washing with CHG (chlorahexidine gluconate) Soap before surgery.  CHG is an antiseptic cleaner which kills germs and bonds with the skin to continue killing germs even after washing.    Oral Hygiene is also important to reduce your risk of infection.  Remember - BRUSH YOUR TEETH THE MORNING OF SURGERY WITH YOUR REGULAR TOOTHPASTE  Please do not use  if you have an allergy to CHG or antibacterial soaps. If your skin becomes reddened/irritated stop using the CHG.  Do not shave (including legs and underarms) for at least 48 hours prior to first CHG shower. It is OK to shave your face.  Please follow these instructions carefully.   Shower the NIGHT BEFORE SURGERY and the MORNING OF SURGERY  If you chose to wash your hair, wash your hair first as usual with your normal shampoo.  After you shampoo, rinse your hair and body thoroughly to remove the shampoo.  Use CHG Soap as you would any other liquid soap. You can apply CHG directly to the skin and wash gently with a scrungie or a clean washcloth.   Apply the CHG Soap to your body ONLY FROM THE NECK DOWN.  Do not use on open wounds or open sores. Avoid contact with your eyes, ears, mouth and genitals (private parts). Wash Face and genitals (private parts)  with your normal soap.   Wash thoroughly, paying special attention to the area where your surgery will be performed.  Thoroughly rinse your body with warm water from the neck down.  DO NOT shower/wash with your normal soap after using and rinsing off the CHG Soap.  Pat yourself dry with a CLEAN TOWEL.  Wear CLEAN PAJAMAS to bed the night before surgery  Place CLEAN SHEETS on your bed the night before your surgery  DO NOT SLEEP WITH PETS.   Day of Surgery: Take a shower with CHG soap. Do not wear jewelry or makeup Do not wear lotions, powders, perfumes/colognes, or deodorant. Do not shave 48 hours prior to surgery.  Men may shave face and neck. Do not bring valuables to the hospital.  [facility] is not responsible for any belongings or valuables. Do not wear nail polish, gel polish, artificial nails, or any other type of covering on natural nails (fingers and toes) If you have artificial nails or gel coating that need to be removed by a nail salon, please have this removed prior to surgery. Artificial nails or gel coating may  interfere with anesthesia's ability to adequately monitor your vital signs. Wear Clean/Comfortable clothing the morning of surgery Remember to brush your teeth WITH YOUR REGULAR TOOTHPASTE.   Please read over the following fact sheets that you were given.    If you received a COVID test during your pre-op visit  it is requested that you wear a mask when out in public, stay away from anyone that may not be feeling well and notify your surgeon if you develop symptoms. If you have been in contact with anyone that has tested positive in the last 10 days please notify you surgeon.

## 2023-01-07 ENCOUNTER — Inpatient Hospital Stay (HOSPITAL_COMMUNITY)
Admission: RE | Admit: 2023-01-07 | Discharge: 2023-01-07 | Disposition: A | Discharge status: Home / Self Care | Payer: BC Managed Care – PPO | Source: Ambulatory Visit

## 2023-01-08 ENCOUNTER — Other Ambulatory Visit: Payer: Self-pay

## 2023-01-08 ENCOUNTER — Encounter (HOSPITAL_COMMUNITY): Payer: Self-pay

## 2023-01-08 ENCOUNTER — Encounter (HOSPITAL_COMMUNITY): Admission: RE | Admit: 2023-01-08 | Payer: BC Managed Care – PPO | Source: Ambulatory Visit

## 2023-01-08 VITALS — BP 120/75 | HR 71 | Temp 97.7°F | Resp 18 | Ht 74.0 in | Wt 203.9 lb

## 2023-01-08 DIAGNOSIS — Z01812 Encounter for preprocedural laboratory examination: Secondary | ICD-10-CM | POA: Diagnosis not present

## 2023-01-08 DIAGNOSIS — Z01818 Encounter for other preprocedural examination: Secondary | ICD-10-CM

## 2023-01-08 LAB — BASIC METABOLIC PANEL
Anion gap: 7 (ref 5–15)
BUN: 10 mg/dL (ref 6–20)
CO2: 26 mmol/L (ref 22–32)
Calcium: 9.1 mg/dL (ref 8.9–10.3)
Chloride: 105 mmol/L (ref 98–111)
Creatinine, Ser: 0.96 mg/dL (ref 0.61–1.24)
GFR, Estimated: >60 mL/min (ref >60)
Glucose, Bld: 92 mg/dL (ref 70–99)
Potassium: 4 mmol/L (ref 3.5–5.1)
Sodium: 138 mmol/L (ref 135–145)

## 2023-01-08 LAB — CBC
HCT: 45 % (ref 39.0–52.0)
Hemoglobin: 15.1 g/dL (ref 13.0–17.0)
MCH: 29.3 pg (ref 26.0–34.0)
MCHC: 33.6 g/dL (ref 30.0–36.0)
MCV: 87.2 fL (ref 80.0–100.0)
Platelets: 244 10*3/uL (ref 150–400)
RBC: 5.16 MIL/uL (ref 4.22–5.81)
RDW: 12 % (ref 11.5–15.5)
WBC: 5.6 10*3/uL (ref 4.0–10.5)
nRBC: 0 % (ref 0.0–0.2)

## 2023-01-08 NOTE — Progress Notes (Signed)
PCP - Dr. Merri Brunette Cardiologist - denies  PPM/ICD - denies Device Orders - n/a Rep Notified - n/a  Chest x-ray - denies EKG - 02-03-13 (patient reports Palpitations at this time but no further problems) Stress Test - n/a ECHO - 02-16-13  Cardiac Cath - (patient reports Palpitations at this time but no further problems)  Sleep Study - Denies CPAP - n/a  DM - denies  Blood Thinner Instructions:Denies Aspirin Instructions:n/a  ERAS Protcol -yes with drink PRE-SURGERY Ensure  COVID TEST- denies   Anesthesia review: no  Patient denies shortness of breath, fever, cough and chest pain at PAT appointment   All instructions explained to the patient, with a verbal understanding of the material. Patient agrees to go over the instructions while at home for a better understanding. Patient also instructed to self quarantine after being tested for COVID-19. The opportunity to ask questions was provided.

## 2023-01-20 NOTE — H&P (Signed)
REFERRING PHYSICIAN: Romero Liner, MD PROVIDER: Wayne Both, MD MRN: [id number] DOB: [birth year]  Subjective   Chief Complaint: New Consultation (Right inguinal hernia)  History of Present Illness: [name] is a 57 y.o. male who is seenas an office consultation for evaluation of New Consultation (Right inguinal hernia)  This is a 57 year old gentleman referred to me for evaluation of inguinal hernias. 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 He noticed a right inguinal hernia almost 2 years ago. It is now getting larger and causing him to have discomfort especially when doing any lifting. He reports he will be able to reduce it and has had no obstructive symptoms. The pain is moderate at times. He has noticed an intermittent small bulge on the left side as well. He has no cardiopulmonary issues. He has had no previous abdominal surgery  Review of Systems: A complete review of systems was obtained from the patient. I have reviewed this information and discussed as appropriate with the patient. See HPI as well for other ROS.  ROS   Medical History: Past Medical History:  Diagnosis Date  Arthritis  Hyperlipidemia   There is no problem list on file for this patient.  Past Surgical History:  Procedure Laterality Date  NASAL SURGERY 1984  TRAPPED NERVE 2004    No Known Allergies  Current Outpatient Medications on File Prior to Visit  Medication Sig Dispense Refill  finasteride (PROPECIA) 1 mg tablet Take 1 mg by mouth once daily  hydrocortisone (ANUSOL-HC) 2.5 % rectal cream APPLY TO AFFECTED AREA OF RECTUM TWICE DAILY 20  meloxicam (MOBIC) 7.5 MG tablet Take 7.5 mg by mouth 2 (two) times daily  rosuvastatin (CRESTOR) 10 MG tablet Take 10 mg by mouth once daily  TRULANCE 3 mg tablet Take 3 mg by mouth once daily  zaleplon (SONATA) 10 MG capsule TAKE 1-2 CAPSULES AT BEDTIME AS NEEDED ORALLY ONCE A DAY 30   No current facility-administered medications on file  prior to visit.   History reviewed. No pertinent family history.   Social History   Tobacco Use  Smoking Status Never  Smokeless Tobacco Never    Social History   Socioeconomic History  Marital status: Married  Tobacco Use  Smoking status: Never  Smokeless tobacco: Never  Substance and Sexual Activity  Alcohol use: Yes  Drug use: Never   Social Determinants of Health   Received from [employer]  Social Network   Objective:   Vitals:   BP: 118/84  Pulse: 103  Temp: 36.6 C (97.9 F)  SpO2: 99%  Weight: 91.2 kg (201 lb)  Height: 188 cm (6\' 2" )  PainSc: 3   Body mass index is 25.81 kg/m.  Physical Exam   He appears well on exam  He does have a moderate sized easily reducible right inguinal hernia. I cannot feel the left inguinal hernia but the groin is weak  Labs, Imaging and Diagnostic Testing: I reviewed his notes in the electronic medical records.  Assessment and Plan:   Diagnoses and all orders for this visit:  Non-recurrent bilateral inguinal hernia without obstruction or gangrene   At this point we discussed abdominal anatomy and hernias. Being a critical care physician, he is well aware of hernias as well as risk of incarceration and strangulation. Given his symptoms, repair with mesh is recommended. I discussed both the laparoscopic and open techniques. I do suspect there is a left inguinal hernia so I would recommend a laparoscopic bilateral inguinal hernia repair with mesh.  We diiscussed the procedure. We discussed the risk which includes but is not limited to bleeding, infection, injury to surrounding structures, nerve entrapment, chronic pain, use of mesh, cardiopulmonary issues, etc. He understands and wished to proceed with surgery which will be scheduled

## 2023-01-21 ENCOUNTER — Ambulatory Visit (HOSPITAL_COMMUNITY): Payer: BC Managed Care – PPO | Admitting: Anesthesiology

## 2023-01-21 ENCOUNTER — Ambulatory Visit (HOSPITAL_COMMUNITY)
Admission: RE | Admit: 2023-01-21 | Discharge: 2023-01-21 | Disposition: A | Discharge status: Home / Self Care | Payer: BC Managed Care – PPO | Attending: Surgery | Admitting: Surgery

## 2023-01-21 ENCOUNTER — Other Ambulatory Visit: Payer: Self-pay

## 2023-01-21 ENCOUNTER — Encounter (HOSPITAL_COMMUNITY): Payer: Self-pay | Admitting: Surgery

## 2023-01-21 ENCOUNTER — Encounter (HOSPITAL_COMMUNITY)
Admission: RE | Disposition: A | Discharge status: Home / Self Care | Payer: Self-pay | Source: Home / Self Care | Attending: Surgery

## 2023-01-21 DIAGNOSIS — K4020 Bilateral inguinal hernia, without obstruction or gangrene, not specified as recurrent: Secondary | ICD-10-CM | POA: Diagnosis not present

## 2023-01-21 DIAGNOSIS — K4090 Unilateral inguinal hernia, without obstruction or gangrene, not specified as recurrent: Secondary | ICD-10-CM | POA: Diagnosis not present

## 2023-01-21 HISTORY — PX: INGUINAL HERNIA REPAIR: SHX194

## 2023-01-21 SURGERY — REPAIR, HERNIA, INGUINAL, BILATERAL, LAPAROSCOPIC
Anesthesia: General | Site: Groin | Laterality: Bilateral

## 2023-01-21 MED ORDER — CHLORHEXIDINE GLUCONATE CLOTH 2 % EX PADS: 6.0000 | MEDICATED_PAD | Freq: Once | CUTANEOUS | Status: DC

## 2023-01-21 MED ORDER — ROCURONIUM BROMIDE 10 MG/ML (PF) SYRINGE
PREFILLED_SYRINGE | INTRAVENOUS | Status: DC | PRN
  Administered 2023-01-21: 10 mg via INTRAVENOUS
  Administered 2023-01-21: 60 mg via INTRAVENOUS

## 2023-01-21 MED ORDER — FENTANYL CITRATE (PF) 250 MCG/5ML IJ SOLN
INTRAMUSCULAR | Status: AC
  Filled 2023-01-21: qty 5

## 2023-01-21 MED ORDER — BUPIVACAINE LIPOSOME 1.3 % IJ SUSP
INTRAMUSCULAR | Status: AC
  Filled 2023-01-21: qty 20

## 2023-01-21 MED ORDER — DEXAMETHASONE SODIUM PHOSPHATE 10 MG/ML IJ SOLN
INTRAMUSCULAR | Status: DC | PRN
  Administered 2023-01-21: 5 mg via INTRAVENOUS

## 2023-01-21 MED ORDER — KETOROLAC TROMETHAMINE 30 MG/ML IJ SOLN
INTRAMUSCULAR | Status: DC | PRN
  Administered 2023-01-21: 30 mg via INTRAVENOUS

## 2023-01-21 MED ORDER — MIDAZOLAM HCL 2 MG/2ML IJ SOLN
INTRAMUSCULAR | Status: AC
  Filled 2023-01-21: qty 2

## 2023-01-21 MED ORDER — KETOROLAC TROMETHAMINE 30 MG/ML IJ SOLN
INTRAMUSCULAR | Status: AC
  Filled 2023-01-21: qty 1

## 2023-01-21 MED ORDER — SUGAMMADEX SODIUM 200 MG/2ML IV SOLN
INTRAVENOUS | Status: DC | PRN
  Administered 2023-01-21: 200 mg via INTRAVENOUS

## 2023-01-21 MED ORDER — FENTANYL CITRATE (PF) 100 MCG/2ML IJ SOLN
INTRAMUSCULAR | Status: AC
  Filled 2023-01-21: qty 2

## 2023-01-21 MED ORDER — OXYCODONE HCL 5 MG PO TABS: 5.0000 mg | ORAL_TABLET | Freq: Once | ORAL | Status: DC | PRN

## 2023-01-21 MED ORDER — MIDAZOLAM HCL 2 MG/2ML IJ SOLN
INTRAMUSCULAR | Status: DC | PRN
  Administered 2023-01-21: 2 mg via INTRAVENOUS

## 2023-01-21 MED ORDER — TRAMADOL HCL 50 MG PO TABS
50.0000 mg | ORAL_TABLET | Freq: Four times a day (QID) | ORAL | 0 refills | Status: AC | PRN
Start: 2023-01-21 — End: ?

## 2023-01-21 MED ORDER — ONDANSETRON HCL 4 MG/2ML IJ SOLN
INTRAMUSCULAR | Status: DC | PRN
  Administered 2023-01-21: 4 mg via INTRAVENOUS

## 2023-01-21 MED ORDER — ONDANSETRON HCL 4 MG/2ML IJ SOLN
INTRAMUSCULAR | Status: AC
  Filled 2023-01-21: qty 2

## 2023-01-21 MED ORDER — CHLORHEXIDINE GLUCONATE 0.12 % MT SOLN
15.0000 mL | Freq: Once | OROMUCOSAL | Status: AC
  Administered 2023-01-21: 15 mL via OROMUCOSAL
  Filled 2023-01-21: qty 15

## 2023-01-21 MED ORDER — BUPIVACAINE HCL (PF) 0.25 % IJ SOLN
INTRAMUSCULAR | Status: DC | PRN
  Administered 2023-01-21: 20 mL

## 2023-01-21 MED ORDER — DEXAMETHASONE SODIUM PHOSPHATE 10 MG/ML IJ SOLN
INTRAMUSCULAR | Status: AC
  Filled 2023-01-21: qty 1

## 2023-01-21 MED ORDER — ENSURE PRE-SURGERY PO LIQD: 296.0000 mL | Freq: Once | ORAL | Status: DC

## 2023-01-21 MED ORDER — CEFAZOLIN SODIUM-DEXTROSE 2-4 GM/100ML-% IV SOLN
2.0000 g | INTRAVENOUS | Status: AC
  Administered 2023-01-21: 2 g via INTRAVENOUS
  Filled 2023-01-21: qty 100

## 2023-01-21 MED ORDER — OXYCODONE HCL 5 MG/5ML PO SOLN: 5.0000 mg | Freq: Once | ORAL | Status: DC | PRN

## 2023-01-21 MED ORDER — SODIUM CHLORIDE 0.9 % IR SOLN
Status: DC | PRN
  Administered 2023-01-21: 1000 mL

## 2023-01-21 MED ORDER — LIDOCAINE 2% (20 MG/ML) 5 ML SYRINGE
INTRAMUSCULAR | Status: DC | PRN
  Administered 2023-01-21: 60 mg via INTRAVENOUS

## 2023-01-21 MED ORDER — ORAL CARE MOUTH RINSE: 15.0000 mL | Freq: Once | OROMUCOSAL | Status: AC

## 2023-01-21 MED ORDER — ACETAMINOPHEN 10 MG/ML IV SOLN: 1000.0000 mg | Freq: Once | INTRAVENOUS | Status: DC | PRN

## 2023-01-21 MED ORDER — ACETAMINOPHEN 500 MG PO TABS
1000.0000 mg | ORAL_TABLET | ORAL | Status: AC
  Administered 2023-01-21: 1000 mg via ORAL
  Filled 2023-01-21: qty 2

## 2023-01-21 MED ORDER — ACETAMINOPHEN 160 MG/5ML PO SOLN: 1000.0000 mg | Freq: Once | ORAL | Status: DC | PRN

## 2023-01-21 MED ORDER — LACTATED RINGERS IV SOLN: INTRAVENOUS | Status: DC

## 2023-01-21 MED ORDER — BUPIVACAINE HCL (PF) 0.25 % IJ SOLN
INTRAMUSCULAR | Status: AC
  Filled 2023-01-21: qty 30

## 2023-01-21 MED ORDER — ACETAMINOPHEN 500 MG PO TABS: 1000.0000 mg | ORAL_TABLET | Freq: Once | ORAL | Status: DC | PRN

## 2023-01-21 MED ORDER — PROPOFOL 10 MG/ML IV BOLUS
INTRAVENOUS | Status: AC
  Filled 2023-01-21: qty 20

## 2023-01-21 MED ORDER — PROPOFOL 10 MG/ML IV BOLUS
INTRAVENOUS | Status: DC | PRN
  Administered 2023-01-21: 20 mg via INTRAVENOUS
  Administered 2023-01-21: 180 mg via INTRAVENOUS

## 2023-01-21 MED ORDER — FENTANYL CITRATE (PF) 250 MCG/5ML IJ SOLN
INTRAMUSCULAR | Status: DC | PRN
  Administered 2023-01-21: 50 ug via INTRAVENOUS
  Administered 2023-01-21: 100 ug via INTRAVENOUS
  Administered 2023-01-21: 50 ug via INTRAVENOUS

## 2023-01-21 MED ORDER — FENTANYL CITRATE (PF) 100 MCG/2ML IJ SOLN
25.0000 ug | INTRAMUSCULAR | Status: DC | PRN
  Administered 2023-01-21 (×2): 50 ug via INTRAVENOUS

## 2023-01-21 SURGICAL SUPPLY — 36 items
ADH SKN CLS APL DERMABOND .7 (GAUZE/BANDAGES/DRESSINGS) ×1
APL PRP STRL LF DISP 70% ISPRP (MISCELLANEOUS) ×1
BAG COUNTER SPONGE SURGICOUNT (BAG) ×1 IMPLANT
BAG SPNG CNTER NS LX DISP (BAG) ×1
BLADE CLIPPER SURG (BLADE) IMPLANT
CANISTER SUCT 3000ML PPV (MISCELLANEOUS) IMPLANT
CHLORAPREP W/TINT 26 (MISCELLANEOUS) ×1 IMPLANT
COVER SURGICAL LIGHT HANDLE (MISCELLANEOUS) ×1 IMPLANT
DERMABOND ADVANCED .7 DNX12 (GAUZE/BANDAGES/DRESSINGS) ×1 IMPLANT
DEVICE SECURE STRAP 25 ABSORB (INSTRUMENTS) IMPLANT
DISSECT BALLN SPACEMKR + OVL (BALLOONS) ×1
DISSECTOR BALLN SPACEMKR + OVL (BALLOONS) ×1 IMPLANT
ELECT CAUTERY BLADE 6.4 (BLADE) IMPLANT
ELECT REM PT RETURN 9FT ADLT (ELECTROSURGICAL) ×1
ELECTRODE REM PT RTRN 9FT ADLT (ELECTROSURGICAL) ×1 IMPLANT
GLOVE SURG SIGNA 7.5 PF LTX (GLOVE) ×1 IMPLANT
GOWN STRL REUS W/ TWL LRG LVL3 (GOWN DISPOSABLE) ×2 IMPLANT
GOWN STRL REUS W/ TWL XL LVL3 (GOWN DISPOSABLE) ×1 IMPLANT
GOWN STRL REUS W/TWL LRG LVL3 (GOWN DISPOSABLE) ×2
GOWN STRL REUS W/TWL XL LVL3 (GOWN DISPOSABLE) ×1
IRRIG SUCT STRYKERFLOW 2 WTIP (MISCELLANEOUS) ×1
IRRIGATION SUCT STRKRFLW 2 WTP (MISCELLANEOUS) IMPLANT
KIT BASIN OR (CUSTOM PROCEDURE TRAY) ×1 IMPLANT
KIT TURNOVER KIT B (KITS) ×1 IMPLANT
MESH 3DMAX 4X6 RT LRG (Mesh General) ×1 IMPLANT
NS IRRIG 1000ML POUR BTL (IV SOLUTION) ×1 IMPLANT
PAD ARMBOARD 7.5X6 YLW CONV (MISCELLANEOUS) ×1 IMPLANT
PENCIL SMOKE EVACUATOR (MISCELLANEOUS) IMPLANT
SET TUBE SMOKE EVAC HIGH FLOW (TUBING) ×1 IMPLANT
SUT MNCRL AB 4-0 PS2 18 (SUTURE) ×1 IMPLANT
TOWEL GREEN STERILE (TOWEL DISPOSABLE) ×1 IMPLANT
TOWEL GREEN STERILE FF (TOWEL DISPOSABLE) ×1 IMPLANT
TRAY LAPAROSCOPIC MC (CUSTOM PROCEDURE TRAY) ×1 IMPLANT
TUBE CONNECTING 12X1/4 (SUCTIONS) IMPLANT
WARMER LAPAROSCOPE (MISCELLANEOUS) IMPLANT
WATER STERILE IRR 1000ML POUR (IV SOLUTION) ×1 IMPLANT

## 2023-01-21 NOTE — Anesthesia Procedure Notes (Signed)
Procedure Name: Intubation Date/Time: 01/21/2023 7:44 AM  Performed by: Gus Puma, CRNAPre-anesthesia Checklist: Patient identified, Emergency Drugs available, Suction available and Patient being monitored Patient Re-evaluated:Patient Re-evaluated prior to induction Oxygen Delivery Method: Circle System Utilized Preoxygenation: Pre-oxygenation with 100% oxygen Induction Type: IV induction Ventilation: Mask ventilation without difficulty Laryngoscope Size: Mac and 4 Grade View: Grade II Tube type: Oral Tube size: 7.5 mm Number of attempts: 1 Airway Equipment and Method: Stylet Placement Confirmation: ETT inserted through vocal cords under direct vision, positive ETCO2 and breath sounds checked- equal and bilateral Secured at: 23 cm Tube secured with: Tape Dental Injury: Teeth and Oropharynx as per pre-operative assessment

## 2023-01-21 NOTE — Transfer of Care (Signed)
Immediate Anesthesia Transfer of Care Note  Patient: [name]  Procedure(s) Performed: LAPAROSCOPIC LOOK ON THE LEFT,  LAPAROSCOPIC RIGHT  INGUINAL HERNIA REPAIR WITH MESH (Bilateral)  Patient Location: PACU  Anesthesia Type:General  Level of Consciousness: awake, alert , oriented, and patient cooperative  Airway & Oxygen Therapy: Patient Spontanous Breathing  Post-op Assessment: Report given to RN and Post -op Vital signs reviewed and stable  Post vital signs: Reviewed and stable  Last Vitals:  Vitals Value Taken Time  BP    Temp    Pulse 88 01/21/23 0837  Resp 20 01/21/23 0837  SpO2 95 % 01/21/23 0837  Vitals shown include unfiled device data.  Last Pain:  Vitals:   01/21/23 0613  TempSrc:   PainSc: 0-No pain         Complications: No notable events documented.

## 2023-01-21 NOTE — Anesthesia Preprocedure Evaluation (Signed)
Anesthesia Evaluation  Patient identified by MRN, date of birth, ID band Patient awake    Reviewed: Allergy & Precautions, H&P , NPO status , Patient's Chart, lab work & pertinent test results  History of Anesthesia Complications Negative for: history of anesthetic complications  Airway Mallampati: II  TM Distance: >3 FB Neck ROM: Full    Dental  (+) Teeth Intact, Dental Advisory Given   Pulmonary neg pulmonary ROS, neg shortness of breath, neg COPD, neg recent URI   breath sounds clear to auscultation       Cardiovascular (-) angina (-) Past MI and (-) CHF negative cardio ROS (-) dysrhythmias  Rhythm:Regular     Neuro/Psych negative neurological ROS  negative psych ROS   GI/Hepatic negative GI ROS, Neg liver ROS,,,  Endo/Other  negative endocrine ROS    Renal/GU negative Renal ROS     Musculoskeletal negative musculoskeletal ROS (+)    Abdominal   Peds  Hematology negative hematology ROS (+) Lab Results      Component                Value               Date                      WBC                      5.6                 01/08/2023                HGB                      15.1                01/08/2023                HCT                      45.0                01/08/2023                MCV                      87.2                01/08/2023                PLT                      244                 01/08/2023              Anesthesia Other Findings HLD  Reproductive/Obstetrics                             Anesthesia Physical Anesthesia Plan  ASA: 1  Anesthesia Plan: General   Post-op Pain Management: Ofirmev IV (intra-op)* and Toradol IV (intra-op)*   Induction: Intravenous  PONV Risk Score and Plan: 2 and Ondansetron and Dexamethasone  Airway Management Planned: LMA and Oral ETT  Additional Equipment: None  Intra-op Plan:   Post-operative Plan: Extubation in  OR  Informed Consent: I have reviewed the  patients History and Physical, chart, labs and discussed the procedure including the risks, benefits and alternatives for the proposed anesthesia with the patient or authorized representative who has indicated his/her understanding and acceptance.     Dental advisory given  Plan Discussed with: CRNA  Anesthesia Plan Comments:        Anesthesia Quick Evaluation

## 2023-01-21 NOTE — Discharge Instructions (Signed)
[facility] _______[facility]  UMBILICAL OR INGUINAL HERNIA REPAIR: POST OP INSTRUCTIONS  Always review your discharge instruction sheet given to you by the facility where your surgery was performed. IF YOU HAVE DISABILITY OR FAMILY LEAVE FORMS, YOU MUST BRING THEM TO THE OFFICE FOR PROCESSING.   DO NOT GIVE THEM TO YOUR DOCTOR.  1. A  prescription for pain medication may be given to you upon discharge.  Take your pain medication as prescribed, if needed.  If narcotic pain medicine is not needed, then you may take acetaminophen (Tylenol) or ibuprofen (Advil) as needed. 2. Take your usually prescribed medications unless otherwise directed. If you need a refill on your pain medication, please contact your pharmacy.  They will contact our office to request authorization. Prescriptions will not be filled after 5 pm or on week-ends. 3. You should follow a light diet the first 24 hours after arrival home, such as soup and crackers, etc.  Be sure to include lots of fluids daily.  Resume your normal diet the day after surgery. 4.Most patients will experience some swelling and bruising around the umbilicus or in the groin and scrotum.  Ice packs and reclining will help.  Swelling and bruising can take several days to resolve.  6. It is common to experience some constipation if taking pain medication after surgery.  Increasing fluid intake and taking a stool softener (such as Colace) will usually help or prevent this problem from occurring.  A mild laxative (Milk of Magnesia or Miralax) should be taken according to package directions if there are no bowel movements after 48 hours. 7. Unless discharge instructions indicate otherwise, you may remove your bandages 24-48 hours after surgery, and you may shower at that time.  You may have steri-strips (small skin tapes) in place directly over the incision.  These strips should be left on the skin for 7-10 days.  If your surgeon used skin glue on the  incision, you may shower in 24 hours.  The glue will flake off over the next 2-3 weeks.  Any sutures or staples will be removed at the office during your follow-up visit. 8. ACTIVITIES:  You may resume regular (light) daily activities beginning the next day--such as daily self-care, walking, climbing stairs--gradually increasing activities as tolerated.  You may have sexual intercourse when it is comfortable.  Refrain from any heavy lifting or straining until approved by your doctor.  a.You may drive when you are no longer taking prescription pain medication, you can comfortably wear a seatbelt, and you can safely maneuver your car and apply brakes. b.RETURN TO WORK:   _____________________________________________  9.You should see your doctor in the office for a follow-up appointment approximately 2-3 weeks after your surgery.  Make sure that you call for this appointment within a day or two after you arrive home to insure a convenient appointment time. 10.OTHER INSTRUCTIONS: __ICE PACK, TYLENOL, AND IBUPROFEN ALSO FOR PAIN OK TO SHOWER STARTING TOMORROW NO LIFTING MORE THAN 15 POUNDS FOR 4 WEEKS_______________________    _____________________________________  WHEN TO CALL YOUR DOCTOR: Fever over 101.0 Inability to urinate Nausea and/or vomiting Extreme swelling or bruising Continued bleeding from incision. Increased pain, redness, or drainage from the incision  The clinic staff is available to answer your questions during regular business hours.  Please don't hesitate to call and ask to speak to one of the nurses for clinical concerns.  If you have a medical emergency, go to the nearest emergency room or call 911.  A surgeon from [facility] is always on call at the hospital   [address], [place], [place]  [address] ?  [address] [address], [place], [place]   [address] [phone] ? [phone] ? FAX [phone] Web site: [email or web address]

## 2023-01-21 NOTE — Interval H&P Note (Signed)
History and Physical Interval Note: no change in H and P  01/21/2023 6:42 AM  [name]  has presented today for surgery, with the diagnosis of BILATERAL INGUINAL HERNIA.  The various methods of treatment have been discussed with the patient and family. After consideration of risks, benefits and other options for treatment, the patient has consented to  Procedure(s): LAPAROSCOPIC BILATERAL INGUINAL HERNIA REPAIR WITH MESH (Bilateral) as a surgical intervention.  The patient's history has been reviewed, patient examined, no change in status, stable for surgery.  I have reviewed the patient's chart and labs.  Questions were answered to the patient's satisfaction.     Abigail Miyamoto

## 2023-01-21 NOTE — Op Note (Signed)
   [name] 01/21/2023   Pre-op Diagnosis: RIGHT INGUINAL HERNIA, POSSIBLE LEFT     Post-op Diagnosis: RIGHT INGUINAL HERNIA  Procedure(s): LAPAROSCOPIC RIGHT INGUINAL HERNIA REPAIR WITH MESH  Surgeon(s): Abigail Miyamoto, MD  Anesthesia: General  Staff:  Circulator: Rogers Seeds, RN Relief Circulator: Lennox Laity, RN Relief Scrub: Coralee North T Scrub Person: Dorris Carnes Circulator Assistant: Milinda Pointer, RN  Estimated Blood Loss: Minimal               Findings: The patient had both an indirect and direct right inguinal hernia without evidence of left inguinal hernia.  The hernia was repaired with a large piece of Prolene 3D max mesh from [employer]  Procedure: The patient was brought to the operating identifies correct patient.  He was placed upon the operating table general anesthesia was induced.  His abdomen was prepped and draped in the usual sterile fashion.  I made a vertical incision below the umbilicus with a scalpel.  I carried this down to the fascia which was then opened just to the right of the midline.  The rectus muscle was identified and elevated.  I passed the dissecting balloon underneath the rectus muscle and manipulated toward the pubis.  The dissected balloon was insufflated dissecting out the preperitoneal space.  The dissecting balloon was then removed and insufflation was began with carbon dioxide.  I placed two 5 mm trocars in the patient midline under direct vision.  I dissected out the right inguinal area first.  He had a moderate-sized indirect hernia sac which I was able to easily reduce from the cord structures.  It was also a small direct defect.  I then evaluated the left inguinal area and saw no evidence of left inguinal hernia.  Next a large piece of Prolene 3D max mesh was brought to the field.  I placed it through the umbilical trocar and open as an onlay on the left inguinal floor.  I then tacked the mesh with the absorbable tacker  along Cooper's ligament, up the medial abdominal wall, and slightly laterally.  Wide coverage of the inguinal floor and core structures appeared to be achieved.  At this point hemostasis also appeared to be achieved.  We then deflated the preperitoneal space and watched the mesh lay appropriately as the space collapsed and the trocars were removed.  I then closed the fascia the umbilicus with a figure-of-eight 0 Vicryl suture.  All incisions were anesthetized with Marcaine I performed a right ilioinguinal nerve block with Marcaine as well.  All incisions were then closed with 4-0 Monocryl and Dermabond.  The patient tolerated the procedure well.  All the counts were correct at the end of the procedure.  The patient was then extubated in the operating room and taken in a stable condition to the recovery room.          Abigail Miyamoto   Date: 01/21/2023  Time: 8:28 AM

## 2023-01-22 ENCOUNTER — Encounter (HOSPITAL_COMMUNITY): Payer: Self-pay | Admitting: Surgery

## 2023-01-26 NOTE — Anesthesia Postprocedure Evaluation (Signed)
Anesthesia Post Note  Patient: [name]  Procedure(s) Performed: LAPAROSCOPIC LOOK ON THE LEFT,  LAPAROSCOPIC RIGHT  INGUINAL HERNIA REPAIR WITH MESH (Bilateral: Groin)     Patient location during evaluation: PACU Anesthesia Type: General Level of consciousness: awake and alert Pain management: pain level controlled Vital Signs Assessment: post-procedure vital signs reviewed and stable Respiratory status: spontaneous breathing, nonlabored ventilation, respiratory function stable and patient connected to nasal cannula oxygen Cardiovascular status: blood pressure returned to baseline and stable Postop Assessment: no apparent nausea or vomiting Anesthetic complications: no   No notable events documented.  Last Vitals:  Vitals:   01/21/23 0915 01/21/23 0921  BP: 111/78 117/76  Pulse: 62 65  Resp: 14 12  Temp:  36.5 C  SpO2: 93% 94%    Last Pain:  Vitals:   01/21/23 0837  TempSrc:   PainSc: 0-No pain                 Aiza Vollrath

## 2023-02-15 DIAGNOSIS — Z09 Encounter for follow-up examination after completed treatment for conditions other than malignant neoplasm: Secondary | ICD-10-CM | POA: Diagnosis not present

## 2023-02-18 DIAGNOSIS — Z125 Encounter for screening for malignant neoplasm of prostate: Secondary | ICD-10-CM | POA: Diagnosis not present

## 2023-02-18 DIAGNOSIS — Z0000 Encounter for general adult medical examination without abnormal findings: Secondary | ICD-10-CM | POA: Diagnosis not present

## 2023-03-03 ENCOUNTER — Other Ambulatory Visit (HOSPITAL_BASED_OUTPATIENT_CLINIC_OR_DEPARTMENT_OTHER): Payer: Self-pay | Admitting: Internal Medicine

## 2023-03-03 DIAGNOSIS — Z0000 Encounter for general adult medical examination without abnormal findings: Secondary | ICD-10-CM | POA: Diagnosis not present

## 2023-03-03 DIAGNOSIS — E041 Nontoxic single thyroid nodule: Secondary | ICD-10-CM | POA: Diagnosis not present

## 2023-03-03 DIAGNOSIS — E7800 Pure hypercholesterolemia, unspecified: Secondary | ICD-10-CM | POA: Diagnosis not present

## 2023-03-03 DIAGNOSIS — L649 Androgenic alopecia, unspecified: Secondary | ICD-10-CM | POA: Diagnosis not present

## 2023-03-03 DIAGNOSIS — G4726 Circadian rhythm sleep disorder, shift work type: Secondary | ICD-10-CM | POA: Diagnosis not present

## 2023-03-03 DIAGNOSIS — Z23 Encounter for immunization: Secondary | ICD-10-CM | POA: Diagnosis not present

## 2023-03-04 ENCOUNTER — Other Ambulatory Visit: Payer: Self-pay | Admitting: Internal Medicine

## 2023-03-04 DIAGNOSIS — E041 Nontoxic single thyroid nodule: Secondary | ICD-10-CM

## 2023-03-16 ENCOUNTER — Ambulatory Visit
Admission: RE | Admit: 2023-03-16 | Discharge: 2023-03-16 | Disposition: A | Discharge status: Home / Self Care | Payer: BC Managed Care – PPO | Source: Ambulatory Visit | Attending: Internal Medicine | Admitting: Internal Medicine

## 2023-03-16 ENCOUNTER — Ambulatory Visit
Admission: RE | Admit: 2023-03-16 | Discharge: 2023-03-16 | Disposition: A | Discharge status: Home / Self Care | Payer: No Typology Code available for payment source | Source: Ambulatory Visit | Attending: Internal Medicine | Admitting: Internal Medicine

## 2023-03-16 DIAGNOSIS — E7800 Pure hypercholesterolemia, unspecified: Secondary | ICD-10-CM

## 2023-03-16 DIAGNOSIS — E041 Nontoxic single thyroid nodule: Secondary | ICD-10-CM | POA: Diagnosis not present

## 2023-03-17 ENCOUNTER — Other Ambulatory Visit: Payer: BC Managed Care – PPO

## 2023-03-25 NOTE — Plan of Care (Signed)
[facility] Tonsillectomy/Adenoidectomy, Postoperative PEDS care plan entered in error.

## 2023-04-13 DIAGNOSIS — I2510 Atherosclerotic heart disease of native coronary artery without angina pectoris: Secondary | ICD-10-CM | POA: Diagnosis not present

## 2023-05-11 DIAGNOSIS — I2510 Atherosclerotic heart disease of native coronary artery without angina pectoris: Secondary | ICD-10-CM | POA: Diagnosis not present

## 2024-03-01 DIAGNOSIS — Z125 Encounter for screening for malignant neoplasm of prostate: Secondary | ICD-10-CM | POA: Diagnosis not present

## 2024-03-06 DIAGNOSIS — Z0000 Encounter for general adult medical examination without abnormal findings: Secondary | ICD-10-CM | POA: Diagnosis not present

## 2024-03-06 DIAGNOSIS — I2510 Atherosclerotic heart disease of native coronary artery without angina pectoris: Secondary | ICD-10-CM | POA: Diagnosis not present

## 2024-03-06 DIAGNOSIS — K5904 Chronic idiopathic constipation: Secondary | ICD-10-CM | POA: Diagnosis not present

## 2024-03-06 DIAGNOSIS — G4726 Circadian rhythm sleep disorder, shift work type: Secondary | ICD-10-CM | POA: Diagnosis not present

## 2024-03-31 DIAGNOSIS — Z23 Encounter for immunization: Secondary | ICD-10-CM | POA: Diagnosis not present
